# Patient Record
Sex: Female | Born: 1935 | Race: White | Hispanic: No | Smoking: Never smoker
Health system: Southern US, Community
[De-identification: ages and names within clinical notes are randomized; demographics above are authoritative.]

## PROBLEM LIST (undated history)

## (undated) DIAGNOSIS — D649 Anemia, unspecified: Secondary | ICD-10-CM

## (undated) DIAGNOSIS — R32 Unspecified urinary incontinence: Secondary | ICD-10-CM

## (undated) DIAGNOSIS — G2581 Restless legs syndrome: Secondary | ICD-10-CM

## (undated) DIAGNOSIS — E7800 Pure hypercholesterolemia, unspecified: Secondary | ICD-10-CM

## (undated) DIAGNOSIS — E079 Disorder of thyroid, unspecified: Secondary | ICD-10-CM

## (undated) DIAGNOSIS — G629 Polyneuropathy, unspecified: Secondary | ICD-10-CM

## (undated) DIAGNOSIS — K219 Gastro-esophageal reflux disease without esophagitis: Secondary | ICD-10-CM

## (undated) DIAGNOSIS — K5790 Diverticulosis of intestine, part unspecified, without perforation or abscess without bleeding: Secondary | ICD-10-CM

## (undated) DIAGNOSIS — F0390 Unspecified dementia without behavioral disturbance: Secondary | ICD-10-CM

---

## 2010-10-11 ENCOUNTER — Emergency Department (HOSPITAL_COMMUNITY): Admission: EM | Admit: 2010-10-11 | Discharge: 2010-10-11 | Payer: Self-pay | Admitting: Emergency Medicine

## 2011-04-16 ENCOUNTER — Inpatient Hospital Stay (HOSPITAL_COMMUNITY)
Admission: AD | Admit: 2011-04-16 | Discharge: 2011-04-19 | DRG: 812 | Disposition: A | Discharge status: Hospice | Payer: Medicare Other | Source: Ambulatory Visit | Attending: Internal Medicine | Admitting: Internal Medicine

## 2011-04-16 DIAGNOSIS — E785 Hyperlipidemia, unspecified: Secondary | ICD-10-CM | POA: Diagnosis present

## 2011-04-16 DIAGNOSIS — F0390 Unspecified dementia without behavioral disturbance: Secondary | ICD-10-CM | POA: Diagnosis present

## 2011-04-16 DIAGNOSIS — K219 Gastro-esophageal reflux disease without esophagitis: Secondary | ICD-10-CM | POA: Diagnosis present

## 2011-04-16 DIAGNOSIS — H409 Unspecified glaucoma: Secondary | ICD-10-CM | POA: Diagnosis present

## 2011-04-16 DIAGNOSIS — J449 Chronic obstructive pulmonary disease, unspecified: Secondary | ICD-10-CM | POA: Diagnosis present

## 2011-04-16 DIAGNOSIS — I4891 Unspecified atrial fibrillation: Secondary | ICD-10-CM | POA: Diagnosis present

## 2011-04-16 DIAGNOSIS — Z515 Encounter for palliative care: Secondary | ICD-10-CM

## 2011-04-16 DIAGNOSIS — D500 Iron deficiency anemia secondary to blood loss (chronic): Principal | ICD-10-CM | POA: Diagnosis present

## 2011-04-16 DIAGNOSIS — K5730 Diverticulosis of large intestine without perforation or abscess without bleeding: Secondary | ICD-10-CM | POA: Diagnosis present

## 2011-04-16 DIAGNOSIS — E039 Hypothyroidism, unspecified: Secondary | ICD-10-CM | POA: Diagnosis present

## 2011-04-16 DIAGNOSIS — E46 Unspecified protein-calorie malnutrition: Secondary | ICD-10-CM | POA: Diagnosis present

## 2011-04-16 DIAGNOSIS — E538 Deficiency of other specified B group vitamins: Secondary | ICD-10-CM | POA: Diagnosis present

## 2011-04-16 DIAGNOSIS — Z9181 History of falling: Secondary | ICD-10-CM

## 2011-04-16 DIAGNOSIS — E871 Hypo-osmolality and hyponatremia: Secondary | ICD-10-CM | POA: Diagnosis present

## 2011-04-16 DIAGNOSIS — J4489 Other specified chronic obstructive pulmonary disease: Secondary | ICD-10-CM | POA: Diagnosis present

## 2011-04-16 DIAGNOSIS — G609 Hereditary and idiopathic neuropathy, unspecified: Secondary | ICD-10-CM | POA: Diagnosis present

## 2011-04-16 DIAGNOSIS — Z7982 Long term (current) use of aspirin: Secondary | ICD-10-CM

## 2011-04-16 LAB — CBC
Hemoglobin: 5.7 g/dL — CL (ref 12.0–15.0)
MCV: 70.1 fL — ABNORMAL LOW (ref 78.0–100.0)
Platelets: 281 10*3/uL (ref 150–400)
RBC: 2.68 MIL/uL — ABNORMAL LOW (ref 3.87–5.11)
WBC: 6.3 10*3/uL (ref 4.0–10.5)

## 2011-04-16 LAB — ABO/RH: ABO/RH(D): A POS

## 2011-04-16 LAB — TSH: TSH: 3.319 u[IU]/mL (ref 0.350–4.500)

## 2011-04-16 LAB — URINALYSIS, ROUTINE W REFLEX MICROSCOPIC
Nitrite: NEGATIVE
Specific Gravity, Urine: 1.012 (ref 1.005–1.030)
Urobilinogen, UA: 1 mg/dL (ref 0.0–1.0)

## 2011-04-16 LAB — VITAMIN B12: Vitamin B-12: 1252 pg/mL — ABNORMAL HIGH (ref 211–911)

## 2011-04-17 LAB — COMPREHENSIVE METABOLIC PANEL
Albumin: 3.5 g/dL (ref 3.5–5.2)
Alkaline Phosphatase: 69 U/L (ref 39–117)
BUN: 11 mg/dL (ref 6–23)
Chloride: 100 mEq/L (ref 96–112)
Glucose, Bld: 91 mg/dL (ref 70–99)
Potassium: 3.5 mEq/L (ref 3.5–5.1)
Total Bilirubin: 0.7 mg/dL (ref 0.3–1.2)

## 2011-04-17 LAB — CBC
HCT: 28.5 % — ABNORMAL LOW (ref 36.0–46.0)
Hemoglobin: 9.1 g/dL — ABNORMAL LOW (ref 12.0–15.0)
MCH: 23.3 pg — ABNORMAL LOW (ref 26.0–34.0)
MCV: 72.3 fL — ABNORMAL LOW (ref 78.0–100.0)
Platelets: 245 10*3/uL (ref 150–400)
RBC: 3.94 MIL/uL (ref 3.87–5.11)
RBC: 3.52 MIL/uL — ABNORMAL LOW (ref 3.87–5.11)
WBC: 9.1 10*3/uL (ref 4.0–10.5)
WBC: 7.3 10*3/uL (ref 4.0–10.5)

## 2011-04-17 NOTE — H&P (Signed)
NAMEJULANN, Sharon Fletcher           ACCOUNT NO.:  [id number]  MEDICAL RECORD NO.:  [id number]           PATIENT TYPE:  I  LOCATION:  4503                         FACILITY:  [facility]  PHYSICIAN:  Clydia Llano, MD       DATE OF BIRTH:  12-08-1929  DATE OF ADMISSION:  04/16/2011 DATE OF DISCHARGE:                             HISTORY & PHYSICAL   PRIMARY CARE PHYSICIAN:  L. Lupe Carney, MD  GASTROENTEROLOGIST:  Llana Aliment. Randa Evens, MD  REASON FOR ADMISSION:  Anemia.  BRIEF HISTORY AND EXAMINATION:  Ms. Azad is an 77 year old pleasantly demented Caucasian female.  Came into the hospital from Dr. Randa Evens' office.  The patient lives in assisted living facility and it has been noticed that her hemoglobin is trending down.  I have sent the patient today to Dr. Randa Evens for evaluation.  He noticed steady decrease in her hemoglobin, before last November 2010 she had hemoglobin of around 12.  April 08, 2011, her hemoglobin was 7.2, and today herhemoglobin is 6.4.  The patient is demented, it is hard to get history from, but denies any hematemesis or melena.  I spoke with Dr. Randa Evens, her fecal occult blood testing was negative at the clinic and he wants to admit her for further evaluation.  PAST MEDICAL HISTORY: 1. Dementia. 2. Peripheral neuropathy. 3. Cervical stenosis. 4. Lumbar stenosis. 5. B12 deficiency. 6. Sleep apnea. 7. Atrial fibrillation. 8. Glaucoma. 9. Gastroesophageal reflux disease. 10.Diverticula. 11.Hyperlipidemia. 12.Hypothyroidism.  MEDICATIONS: 1. Amiodarone 200 mg p.o. daily. 2. Atorvastatin 20 mg p.o. daily. 3. Levothyroxine 50 mcg p.o. daily. 4. Omeprazole 20 mg p.o. daily. 5. Gabapentin 100 mg p.o. daily. 6. Aspirin 325 mg p.o. daily. 7. Lotrisone 0.5% lotion apply to affected area twice daily. 8. Cyanocobalamin 1000 mcg p.o. daily. 9. Combigan 0.2 to 0.5% solution 1 drop in effect twice a day. 10.Lumigan 0.03% solution 1 drop in to affected eye  every evening once     a day. 11.Patanol 0.1% solution 1 drop in affected right twice a day. 12.Artificial tears solution 1 drop both eyes 4 times a day. 13.Polyethylene glycol powder 1 capful and liquid once a day. 14.Fish oil 1000 mg p.o. daily. 15.Ear wax removal daily. 16.Cipro 250 mg p.o. b.i.d.  ALLERGIES:  NKDA.  FAMILY HISTORY:  Unable to provide, the patient is demented.  SOCIAL HISTORY:  The patient lives in assisted living facility in [place] because of dementia.  REVIEW OF SYSTEMS:  GENERAL:  Denies fevers, chills, or sweats. HEENT:  Denies headache, denies nasal discharge. SKIN:  Denies rash or lesions. CARDIAC:  Denies chest pain, palpitations, or orthopnea. PULMONARY:  Denies shortness of breath.  Denies wheezing or cough. GENITOURINARY:  Denies frequency, urgency, or dysuria. NEUROLOGY:  Denies weakness, numbness, or disturbance. MUSCULOSKELETAL:  Denies arthralgia, joint swelling, deformity, or pain. GASTROINTESTINAL:  Denies nausea, vomiting, or diarrhea. ENDOCRINE:  Denies polyuria or dysuria. PSYCHIATRIC:  Denies mood disturbances or depression.  PHYSICAL EXAMINATION:  VITAL SIGNS:  Temperature is 97.4, pulse is 67, respirations 18, blood pressure is 111/64, and O2 sats is 99% on room air. GENERAL:  The patient is well-developed Caucasian female  sitting on a chair wearing hospital gown in no acute distress.  The patient is alert and awake, disoriented to place, time, and person. HEENT:  Head is normocephalic, atraumatic.  Normal eyes.  Pupils are equal and reactive to light and accommodation.  Mouth without oral thrush or lesions. NECK:  Supple.  No masses, no meningeal signs, no lymphadenopathy. CHEST:  Moves to breathing bilaterally with symmetry. LUNGS:  Clear to auscultation bilaterally. CARDIOVASCULAR:  Regular rate and rhythm.  No murmurs, rubs, or gallops. ABDOMEN:  Bowel sounds heard.  Soft, nontender or distended.   No hepatosplenomegaly. GENITOURINARY:  No CVA tenderness appreciated. LOWER EXTREMITIES:  No pedal edema. NEUROLOGIC:  Alert and awake, disoriented.  No focal motor or sensory deficit.  Cranial nerves II through XII grossly intact.  LABORATORY DATA:  Labs per Dr. Diamantina Providence office hemoglobin 6.4 yesterday at 3 p.m.  ASSESSMENT/PLAN: 1. Anemia.  The patient will be admitted to the hospital for further     evaluation.  Anemia panel will be obtained.  The patient has     microcytic with MCV of 69, probably it is iron-deficiency anemia.     Dr. Randa Evens to evaluate the patient.  Because of anemia of 6.4, the     patient will have 2 units of blood transfused after the anemia     panel obtained. 2. Atrial fibrillation.  From the records, the patient on amiodarone     and full dose aspirin, not on Coumadin.  I do not have the exact     reason maybe because of her age and fall risk.  According to the     records here, she was evaluated in October 2011, with CT scan. 3. Hypothyroidism.  We will restart the levothyroxine. 4. Gastroesophageal reflux disease on omeprazole 20 mg that will be     continued.     Clydia Llano, MD     ME/MEDQ  D:  04/16/2011  T:  04/16/2011  Job:  [id number]  cc:   Fayrene Fearing L. Malon Kindle., M.D. Elsworth Soho, M.D.  Electronically Signed by Clydia Llano  on 04/17/2011 12:45:16 PM

## 2011-04-18 ENCOUNTER — Inpatient Hospital Stay (HOSPITAL_COMMUNITY): Payer: Medicare Other

## 2011-04-18 LAB — CBC
HCT: 24.7 % — ABNORMAL LOW (ref 36.0–46.0)
HCT: 26.1 % — ABNORMAL LOW (ref 36.0–46.0)
Hemoglobin: 8.3 g/dL — ABNORMAL LOW (ref 12.0–15.0)
RBC: 3.55 MIL/uL — ABNORMAL LOW (ref 3.87–5.11)
RDW: 18.4 % — ABNORMAL HIGH (ref 11.5–15.5)
WBC: 6.4 10*3/uL (ref 4.0–10.5)
WBC: 7.1 10*3/uL (ref 4.0–10.5)

## 2011-04-18 LAB — CROSSMATCH
Antibody Screen: NEGATIVE
Unit division: 0

## 2011-04-18 LAB — BASIC METABOLIC PANEL
Glucose, Bld: 99 mg/dL (ref 70–99)
Potassium: 3.3 mEq/L — ABNORMAL LOW (ref 3.5–5.1)
Sodium: 135 mEq/L (ref 135–145)

## 2011-04-19 LAB — CBC
HCT: 27.4 % — ABNORMAL LOW (ref 36.0–46.0)
Hemoglobin: 8.7 g/dL — ABNORMAL LOW (ref 12.0–15.0)
MCH: 23.3 pg — ABNORMAL LOW (ref 26.0–34.0)
MCV: 73.3 fL — ABNORMAL LOW (ref 78.0–100.0)
RBC: 3.74 MIL/uL — ABNORMAL LOW (ref 3.87–5.11)

## 2011-04-20 NOTE — Discharge Summary (Signed)
Sharon Fletcher, LEMBERGER           ACCOUNT NO.:  [id number]  MEDICAL RECORD NO.:  [id number]           PATIENT TYPE:  I  LOCATION:  4715                         FACILITY:  [facility]  PHYSICIAN:  Pleas Koch, MD        DATE OF BIRTH:  1929-09-11  DATE OF ADMISSION:  04/16/2011 DATE OF DISCHARGE:  04/19/2011                              DISCHARGE SUMMARY   DISCHARGE DIAGNOSES: 1. Anemia. 2. Atrial fibrillation. 3. Hypothyroidism. 4. Gastroesophageal reflux disease. 5. Glaucoma. 6. Colonic diverticula. 7. Dementia.  PERTINENT CONSULTS:  Gastroenterology, Dr. Willis Modena and Palliative Care Medicine, Dorian Pod, ACNP.  DISCHARGE MEDICATIONS: 1. Hydrocortisone 1% topically one application b.i.d. 2. QVAR 8.7/120 two puffs b.i.d. 3. Combigan to both eyes 1 drop q.12. 4. Please note amiodarone has been discontinued. 5. Gabapentin 100 mg b.i.d. 6. Clotrimazole 1-0.05% topically one application b.i.d. 7. Fish oil 1 cap b.i.d.8. polyethylene glycol 17 grams daily. 9. Please note, the atorvastatin has been discontinued as is aspirin     325 mg. 10.Lumigan 0.03% to both eyes one drop at bedtime. 11.Ciprofloxacin was discontinued. 12.Levothyroxine 50 mcg p.o. daily. 13.Debrox ear drops to each ear 3 drops daily. 14.Artificial tears both eyes q.i.d. 15.PreviDent Gel to brush teeth one application b.i.d. 16.Vitamin B12 one tablet q.a.m.  HISTORY:  Briefly, this is an 77 year old demented Caucasian female, admitted from Dr. Lily Lovings office.  She lives in an assisted living facility as noted that her hemoglobin is trending down.  Dr. Randa Evens sent into hospital for evaluation as he noted slightly decreased hemoglobin, in November 2010 it was 12; April 08, 2011, it was 7.2; and today is 6.4.  The patient is dementia, hard to get a history, but denies any hematemesis or melena.  Dr. Randa Evens noted negative fecal occult blood testing per admit MD's note.  HOSPITAL COURSE: 1.  GI  Bleed:  The patient was transfused 2 units of packed red blood     cells and maintained a hemoglobin of about 8 to 9.  She underwent a     barium enema on Apr 18, 2011, which showed mild-to-moderate degree     of single contrast exam with no circumferential abnormalities and     extensive colonic diverticulosis.  It was discussed with the     patient's relative, son Sharon Fletcher, and palliative care team goals of     care and it was noted that the patient may benefit from another     transfusion at some point in time, however, at this point, there     will be no further aggressive measures.  The patient was     discontinued off her aspirin.  The patient also discontinued off     her amiodarone as she is out of AFib.  I will call her son, Sharon Fletcher,     at [phone] to explain our concerns. 2. AFib.  The patient does have an elevated [place] score; however, this     will be precluded with her recent anemia. 3  Anemia.  This is likely anemia of iron deficiency with some amount of blood loss.  Her iron level was less 10 and  vitamin B12 is 1252.  It does seem to be a microcytic anemia. 4. Hypothyroidism.  She will continue on her thyroxine as previously     on. 5. Hyponatremia.  The patient's sodium was repleted with IV fluids and     this resolved. 6. COPD.  The patient can continue her QVAR. 7. Hyperlipidemia.  I have discontinued her statin as I do not believe     that she would benefit from this long-term as the prognosis for the     patient has been discharged as discussion per palliative care team     and Dr. Dulce Sellar and myself.  The patient will likely benefit from     being on hospice and have palliative care follow as an outpatient.  The patient was seen on day of discharge and noted to be doing well.  PHYSICAL EXAMINATION:  Her vitals were as follows. VITALS:  Temperature is 97, blood pressure is 131-150/74-84, pulse was 72, respirations are 20, the patient is satting 95% on room  air. GENERAL:  She is pleasantly demented.  She had some saccades of her eyes. CHEST:  Clinically clear. ABDOMEN:  Soft, nontender, and nondistended.  She was pleasantly demented, but was in no apparent distress.          ______________________________ Pleas Koch, MD     JS/MEDQ  D:  04/19/2011  T:  04/19/2011  Job:  [id number]  cc:   Dorian Pod, ACNP Willis Modena, MD  Electronically Signed by Pleas Koch MD on 04/20/2011 03:53:22 PM

## 2011-05-22 NOTE — Consult Note (Signed)
NAME:  Sharon Fletcher, Sharon Fletcher           ACCOUNT NO.:  [id number]  MEDICAL RECORD NO.:  [id number]           PATIENT TYPE:  LOCATION:                                 FACILITY:  PHYSICIAN:  Meria Crilly L. Ladona Ridgel, MD  DATE OF BIRTH:  1929-05-27  DATE OF CONSULTATION:  04/18/2011 DATE OF DISCHARGE:                                CONSULTATION   REQUESTING PHYSICIAN:  Pleas Koch, MD  COLLABORATING PHYSICIAN:  Trinitie Mcgirr L. Ladona Ridgel, MD  REASON FOR CONSULTATION:  Medical treatment options.  Dorian Pod reviewed medical records, received report from the team, examined the patient, then met with the patient's son Sharon Fletcher to discuss goals, options, and end-of-life issues.  A detailed discussion was had regarding advanced directives with concept specific to the difference between an aggressive medical interventions path versus a palliative comfort care path for this patient at this time in her current situation.  Time in 13:30, time out 3:00 p.m. with a 90-minute consultation.  Greater than 50% of this time spent in medical counseling and coordination of care and discussion of the pathophysiology of the patient's disease process.  Also discussed the patient's case with Dr. Willis Modena who is following along with the patient.  PROBLEM LIST: 1. Generalized weakness with anemia, hemoglobin 5.7 in the setting of     iron deficiency. 2. Altered mental status. 3. Protein malnutrition. 4. Agitation. 5. Anorexia. 6. Palliative performance score of 30%.  Goals of care established by the patient's son at this time and confirmed with the patient. 1. DNR/DNI status. 2. MOST letters form completed which states DNR/DNI status, limited     additional interventions.  The patient's son is receptive to using     medical treatments including blood, blood transfusions, short-term     IV fluids, has indicated labs but no invasive testing.  He is     receptive to his mom, return to the hospital as  indicated but avoid     ICU stay.  Antibiotics:  Complete current round of antibiotics.  No     further antibiotic treatments.  IV fluids:  Short-term treatment     only.  Nutrition:  Comfort feeds only.  No PEG or panda placement     or TPN therapy.  Son is very realistic.  We discussed the     physiology of his mother's dementia which is the underlying     etiology for her failure to thrive.  We discussed recurrent     infections, her high risk for falls and her anemia.  Note Dr.     Dulce Sellar does not feel the patient's blood loss is related to GI     issues, most likely chronic iron deficiency.  [name], the patient's     son wants to follow labs now and transfuse if indicated for     comfort, but eventually stop lab work and blood products.  DISPOSITION:  The plan is to return to [facility] with hospice. Son has MOST form already and I have signed it to adjust medications.  I have done and discussed it with Dr. Mahala Menghini and Dr. Dulce Sellar with the changes the  patient's son wish to make with medications.  Thank you for the opportunity to assist with this patient's care. Dorian Pod could be reached at [phone].     Dorian Pod, ACNP   ______________________________ Katharina Caper. Ladona Ridgel, MD    MB/MEDQ  D:  05/05/2011  T:  05/05/2011  Job:  [id number]  cc:   Pleas Koch, MD Willis Modena, MD  Electronically Signed by Dorian Pod ACNP on 05/13/2011 10:43:13 AM Electronically Signed by Derenda Mis MD on 05/22/2011 11:55:51 AM

## 2011-05-26 ENCOUNTER — Emergency Department (HOSPITAL_COMMUNITY)
Admission: EM | Admit: 2011-05-26 | Discharge: 2011-05-26 | Disposition: A | Discharge status: Home / Self Care | Payer: Medicare Other | Attending: Emergency Medicine | Admitting: Emergency Medicine

## 2011-05-26 DIAGNOSIS — F0390 Unspecified dementia without behavioral disturbance: Secondary | ICD-10-CM | POA: Insufficient documentation

## 2011-05-26 DIAGNOSIS — S0990XA Unspecified injury of head, initial encounter: Secondary | ICD-10-CM | POA: Insufficient documentation

## 2011-05-26 DIAGNOSIS — Y92009 Unspecified place in unspecified non-institutional (private) residence as the place of occurrence of the external cause: Secondary | ICD-10-CM | POA: Insufficient documentation

## 2011-05-26 DIAGNOSIS — W19XXXA Unspecified fall, initial encounter: Secondary | ICD-10-CM | POA: Insufficient documentation

## 2011-06-30 NOTE — Consult Note (Signed)
NAMELYNDAL, ALAMILLO           ACCOUNT NO.:  [id number]  MEDICAL RECORD NO.:  [id number]           PATIENT TYPE:  I  LOCATION:  4715                         FACILITY:  [facility]  PHYSICIAN:  Willis Modena, MD     DATE OF BIRTH:  [birth year]  DATE OF CONSULTATION:  04/17/2011 DATE OF DISCHARGE:                                CONSULTATION   REASON FOR CONSULTATION:  Anemia.  CHIEF COMPLAINT:  Anemia.  HISTORY OF PRESENT ILLNESS:  Ms. Fialkowski is an 77 year old female with profound dementia.  She lives in an assisted living facility and currently has a Comptroller in the room with her.  She is unable to provide any meaningful history.  She was apparently seen by Dr. Randa Evens in our office and admitted here for evaluation of anemia with microcytosis. There is no obvious report of GI bleeding.  No other history is available.  It is uncertain whether she has had endoscopies or colonoscopies in the past.  PAST MEDICAL/PAST SURGICAL HISTORY/HOME MEDICATIONS/ALLERGIES/FAMILY HISTORY/SOCIAL HISTORY/REVIEW OF SYSTEMS:  All from dictated note from Dr. Arthor Captain dated Apr 16, 2011.  I have reviewed and I agree.  PHYSICAL EXAMINATION:  VITAL SIGNS:  T-max 99.4, oxygen saturation 90% on room air, pulse 68, respiratory rate 20, blood pressure 124/69. GENERAL:  Ms. Salamon is pleasantly demented, unable to answer any questions appropriately. HEENT:  No oropharyngeal lesions.  Eyes:  Sclerae anicteric. Conjunctivae are pale. NECK:  Supple. LUNGS:  Clear. HEART:  Regular.  No murmur, rub, or gallop. ABDOMEN:  Soft, nontender, nondistended.  Normoactive bowel sounds. EXTREMITIES:  No peripheral cyanosis, clubbing, or edema. NEUROLOGIC:  Demented, unable to answer any questions meaningfully, otherwise seemingly nonfocal.  LABORATORY DATA:  Hemoglobin admission 5.7, currently 9.1 after transfusion.  White count 9.1, platelet count 291,000.  Sodium 132, potassium 3.5, chloride 100, bicarb 21, BUN  11, creatinine 0.5.  Liver tests are normal.  Iron indices show a low iron and a borderline low ferritin, MCV 72.  B12, folate levels are not low.  IMPRESSION:  Ms. Frier is a 77 year old female with profound dementia presenting with anemia.  There is no overt bleeding.  I am unable to provide any other history from her.  It is unclear whether she has had endoscopy or colonoscopy in the past.  PLAN: 1. At this point I am uncertain whether there is any utility in     pursuing diagnostic evaluation for her anemia. 2. I have made attempts to reach the patient's son, [name],     but have been unable to do so.  I have left a message asking him to     call us back. 3. I would like to have an in depth conversation with him about the     relative risks and benefits for endoscopy and colonoscopy, which,     outside of her profound dementia, would likely be the best     diagnostic test.  However, in this situation, it is unclear to me     whether she would be a candidate for chemotherapy, surgery, or any     kind of therapy even should she have  something severe such as a     malignancy identified.  Thanks again for allowing [place] to participate in the care of Ms. Wyke's care.     Willis Modena, MD     WO/MEDQ  D:  04/17/2011  T:  04/17/2011  Job:  [id number]  Electronically Signed by Willis Modena  on 06/30/2011 01:14:03 PM

## 2012-04-19 ENCOUNTER — Other Ambulatory Visit (HOSPITAL_COMMUNITY): Payer: Self-pay | Admitting: *Deleted

## 2012-04-21 ENCOUNTER — Ambulatory Visit (HOSPITAL_COMMUNITY)
Admission: RE | Admit: 2012-04-21 | Discharge: 2012-04-21 | Disposition: A | Discharge status: Home / Self Care | Payer: Medicare Other | Source: Ambulatory Visit | Attending: Family Medicine | Admitting: Family Medicine

## 2012-04-21 DIAGNOSIS — Z5189 Encounter for other specified aftercare: Secondary | ICD-10-CM | POA: Insufficient documentation

## 2012-04-21 MED ORDER — FUROSEMIDE 10 MG/ML IJ SOLN
20.0000 mg | Freq: Once | INTRAMUSCULAR | Status: AC
  Administered 2012-04-21: 20 mg via INTRAVENOUS
  Filled 2012-04-21 (×2): qty 2

## 2012-04-22 LAB — TYPE AND SCREEN
ABO/RH(D): A POS
Antibody Screen: NEGATIVE
Unit division: 0

## 2012-05-05 ENCOUNTER — Observation Stay (HOSPITAL_COMMUNITY): Payer: Medicare Other

## 2012-05-05 ENCOUNTER — Inpatient Hospital Stay (HOSPITAL_COMMUNITY)
Admission: EM | Admit: 2012-05-05 | Discharge: 2012-05-08 | DRG: 193 | Disposition: A | Discharge status: Home Health | Payer: Medicare Other | Attending: Internal Medicine | Admitting: Internal Medicine

## 2012-05-05 ENCOUNTER — Encounter (HOSPITAL_COMMUNITY): Payer: Self-pay | Admitting: Neurology

## 2012-05-05 DIAGNOSIS — Z79899 Other long term (current) drug therapy: Secondary | ICD-10-CM

## 2012-05-05 DIAGNOSIS — Z66 Do not resuscitate: Secondary | ICD-10-CM | POA: Diagnosis present

## 2012-05-05 DIAGNOSIS — M48061 Spinal stenosis, lumbar region without neurogenic claudication: Secondary | ICD-10-CM | POA: Diagnosis present

## 2012-05-05 DIAGNOSIS — R404 Transient alteration of awareness: Secondary | ICD-10-CM

## 2012-05-05 DIAGNOSIS — K219 Gastro-esophageal reflux disease without esophagitis: Secondary | ICD-10-CM | POA: Diagnosis present

## 2012-05-05 DIAGNOSIS — R112 Nausea with vomiting, unspecified: Secondary | ICD-10-CM

## 2012-05-05 DIAGNOSIS — H409 Unspecified glaucoma: Secondary | ICD-10-CM | POA: Diagnosis present

## 2012-05-05 DIAGNOSIS — G609 Hereditary and idiopathic neuropathy, unspecified: Secondary | ICD-10-CM | POA: Diagnosis present

## 2012-05-05 DIAGNOSIS — J449 Chronic obstructive pulmonary disease, unspecified: Secondary | ICD-10-CM | POA: Diagnosis present

## 2012-05-05 DIAGNOSIS — F0390 Unspecified dementia without behavioral disturbance: Secondary | ICD-10-CM | POA: Diagnosis present

## 2012-05-05 DIAGNOSIS — G9340 Encephalopathy, unspecified: Secondary | ICD-10-CM

## 2012-05-05 DIAGNOSIS — Z515 Encounter for palliative care: Secondary | ICD-10-CM

## 2012-05-05 DIAGNOSIS — G2581 Restless legs syndrome: Secondary | ICD-10-CM | POA: Diagnosis present

## 2012-05-05 DIAGNOSIS — M4802 Spinal stenosis, cervical region: Secondary | ICD-10-CM | POA: Diagnosis present

## 2012-05-05 DIAGNOSIS — I959 Hypotension, unspecified: Secondary | ICD-10-CM

## 2012-05-05 DIAGNOSIS — E538 Deficiency of other specified B group vitamins: Secondary | ICD-10-CM | POA: Diagnosis present

## 2012-05-05 DIAGNOSIS — J4489 Other specified chronic obstructive pulmonary disease: Secondary | ICD-10-CM | POA: Diagnosis present

## 2012-05-05 DIAGNOSIS — J189 Pneumonia, unspecified organism: Principal | ICD-10-CM | POA: Diagnosis present

## 2012-05-05 DIAGNOSIS — M159 Polyosteoarthritis, unspecified: Secondary | ICD-10-CM | POA: Diagnosis present

## 2012-05-05 DIAGNOSIS — R4182 Altered mental status, unspecified: Secondary | ICD-10-CM | POA: Diagnosis present

## 2012-05-05 DIAGNOSIS — I4891 Unspecified atrial fibrillation: Secondary | ICD-10-CM | POA: Diagnosis present

## 2012-05-05 DIAGNOSIS — E032 Hypothyroidism due to medicaments and other exogenous substances: Secondary | ICD-10-CM

## 2012-05-05 DIAGNOSIS — G929 Unspecified toxic encephalopathy: Secondary | ICD-10-CM | POA: Diagnosis present

## 2012-05-05 DIAGNOSIS — G92 Toxic encephalopathy: Secondary | ICD-10-CM | POA: Diagnosis present

## 2012-05-05 DIAGNOSIS — D649 Anemia, unspecified: Secondary | ICD-10-CM | POA: Diagnosis present

## 2012-05-05 DIAGNOSIS — E039 Hypothyroidism, unspecified: Secondary | ICD-10-CM | POA: Diagnosis present

## 2012-05-05 DIAGNOSIS — E785 Hyperlipidemia, unspecified: Secondary | ICD-10-CM | POA: Diagnosis present

## 2012-05-05 DIAGNOSIS — E860 Dehydration: Secondary | ICD-10-CM | POA: Diagnosis present

## 2012-05-05 DIAGNOSIS — D509 Iron deficiency anemia, unspecified: Secondary | ICD-10-CM | POA: Diagnosis present

## 2012-05-05 DIAGNOSIS — G4733 Obstructive sleep apnea (adult) (pediatric): Secondary | ICD-10-CM | POA: Diagnosis present

## 2012-05-05 HISTORY — DX: Unspecified dementia, unspecified severity, without behavioral disturbance, psychotic disturbance, mood disturbance, and anxiety: F03.90

## 2012-05-05 HISTORY — DX: Diverticulosis of intestine, part unspecified, without perforation or abscess without bleeding: K57.90

## 2012-05-05 HISTORY — DX: Gastro-esophageal reflux disease without esophagitis: K21.9

## 2012-05-05 HISTORY — DX: Unspecified urinary incontinence: R32

## 2012-05-05 HISTORY — DX: Anemia, unspecified: D64.9

## 2012-05-05 HISTORY — DX: Restless legs syndrome: G25.81

## 2012-05-05 HISTORY — DX: Polyneuropathy, unspecified: G62.9

## 2012-05-05 HISTORY — DX: Pure hypercholesterolemia, unspecified: E78.00

## 2012-05-05 LAB — CBC
MCH: 25.1 pg — ABNORMAL LOW (ref 26.0–34.0)
MCHC: 31.1 g/dL (ref 30.0–36.0)
MCV: 80.7 fL (ref 78.0–100.0)
Platelets: ADEQUATE 10*3/uL (ref 150–400)

## 2012-05-05 LAB — BASIC METABOLIC PANEL
Calcium: 8.9 mg/dL (ref 8.4–10.5)
Creatinine, Ser: 0.38 mg/dL — ABNORMAL LOW (ref 0.50–1.10)
GFR calc non Af Amer: >90 mL/min (ref >90)
Glucose, Bld: 81 mg/dL (ref 70–99)
Sodium: 136 mEq/L (ref 135–145)

## 2012-05-05 LAB — PROCALCITONIN: Procalcitonin: <0.1 ng/mL

## 2012-05-05 LAB — CARDIAC PANEL(CRET KIN+CKTOT+MB+TROPI)
CK, MB: 2.9 ng/mL (ref 0.3–4.0)
Relative Index: INVALID (ref 0.0–2.5)
Total CK: 58 U/L (ref 7–177)
Troponin I: <0.3 ng/mL (ref <0.30)

## 2012-05-05 LAB — DIFFERENTIAL
Eosinophils Absolute: 0.4 10*3/uL (ref 0.0–0.7)
Lymphs Abs: 2.6 10*3/uL (ref 0.7–4.0)
Monocytes Absolute: 0.7 10*3/uL (ref 0.1–1.0)
Neutrophils Relative %: 49 % (ref 43–77)

## 2012-05-05 LAB — URINALYSIS, ROUTINE W REFLEX MICROSCOPIC
Glucose, UA: NEGATIVE mg/dL
Hgb urine dipstick: NEGATIVE
Ketones, ur: NEGATIVE mg/dL
Protein, ur: NEGATIVE mg/dL
Urobilinogen, UA: 2 mg/dL — ABNORMAL HIGH (ref 0.0–1.0)

## 2012-05-05 LAB — LACTIC ACID, PLASMA: Lactic Acid, Venous: 1.5 mmol/L (ref 0.5–2.2)

## 2012-05-05 MED ORDER — RISPERIDONE 1 MG PO TABS
1.0000 mg | ORAL_TABLET | Freq: Every day | ORAL | Status: DC
Start: 2012-05-05 — End: 2012-05-08
  Administered 2012-05-05 – 2012-05-08 (×4): 1 mg via ORAL
  Filled 2012-05-05 (×4): qty 1

## 2012-05-05 MED ORDER — OLOPATADINE HCL 0.1 % OP SOLN
1.0000 [drp] | Freq: Two times a day (BID) | OPHTHALMIC | Status: DC
  Administered 2012-05-05 – 2012-05-08 (×6): 1 [drp] via OPHTHALMIC
  Filled 2012-05-05: qty 5

## 2012-05-05 MED ORDER — TIMOLOL MALEATE 0.5 % OP SOLN
1.0000 [drp] | Freq: Two times a day (BID) | OPHTHALMIC | Status: DC
  Administered 2012-05-05 – 2012-05-08 (×6): 1 [drp] via OPHTHALMIC
  Filled 2012-05-05: qty 5

## 2012-05-05 MED ORDER — OMEGA-3-ACID ETHYL ESTERS 1 G PO CAPS
1.0000 g | ORAL_CAPSULE | Freq: Two times a day (BID) | ORAL | Status: DC
  Administered 2012-05-05 – 2012-05-08 (×5): 1 g via ORAL
  Filled 2012-05-05 (×7): qty 1

## 2012-05-05 MED ORDER — HYPROMELLOSE 0.4 % OP SOLN: 1.0000 [drp] | Freq: Four times a day (QID) | OPHTHALMIC | Status: DC

## 2012-05-05 MED ORDER — SODIUM CHLORIDE 0.9 % IJ SOLN
3.0000 mL | Freq: Two times a day (BID) | INTRAMUSCULAR | Status: DC
  Administered 2012-05-05 – 2012-05-07 (×2): 3 mL via INTRAVENOUS

## 2012-05-05 MED ORDER — IOHEXOL 350 MG/ML SOLN
100.0000 mL | Freq: Once | INTRAVENOUS | Status: AC | PRN
  Administered 2012-05-05: 100 mL via INTRAVENOUS

## 2012-05-05 MED ORDER — ACETAMINOPHEN 650 MG RE SUPP: 650.0000 mg | Freq: Four times a day (QID) | RECTAL | Status: DC | PRN

## 2012-05-05 MED ORDER — ONDANSETRON HCL 4 MG/2ML IJ SOLN: 4.0000 mg | Freq: Four times a day (QID) | INTRAMUSCULAR | Status: DC | PRN

## 2012-05-05 MED ORDER — SODIUM CHLORIDE 0.9 % IJ SOLN: 3.0000 mL | INTRAMUSCULAR | Status: DC | PRN

## 2012-05-05 MED ORDER — ACETAMINOPHEN 325 MG PO TABS: 650.0000 mg | ORAL_TABLET | Freq: Four times a day (QID) | ORAL | Status: DC | PRN

## 2012-05-05 MED ORDER — OMEGA-3 FATTY ACIDS 1000 MG PO CAPS
1.0000 g | ORAL_CAPSULE | Freq: Two times a day (BID) | ORAL | Status: DC
Start: 2012-05-05 — End: 2012-05-05

## 2012-05-05 MED ORDER — POLYETHYLENE GLYCOL 3350 17 G PO PACK
17.0000 g | PACK | Freq: Every day | ORAL | Status: DC
  Administered 2012-05-05 – 2012-05-08 (×4): 17 g via ORAL
  Filled 2012-05-05 (×4): qty 1

## 2012-05-05 MED ORDER — ONDANSETRON HCL 4 MG PO TABS: 4.0000 mg | ORAL_TABLET | Freq: Four times a day (QID) | ORAL | Status: DC | PRN

## 2012-05-05 MED ORDER — SODIUM CHLORIDE 0.9 % IV BOLUS (SEPSIS)
500.0000 mL | Freq: Once | INTRAVENOUS | Status: AC
  Administered 2012-05-05: 500 mL via INTRAVENOUS

## 2012-05-05 MED ORDER — BRIMONIDINE TARTRATE 0.2 % OP SOLN
1.0000 [drp] | Freq: Two times a day (BID) | OPHTHALMIC | Status: DC
  Administered 2012-05-05 – 2012-05-08 (×6): 1 [drp] via OPHTHALMIC
  Filled 2012-05-05: qty 5

## 2012-05-05 MED ORDER — NYSTATIN 100000 UNIT/GM EX POWD
Freq: Every morning | CUTANEOUS | Status: DC
  Administered 2012-05-06 – 2012-05-08 (×3): via TOPICAL
  Filled 2012-05-05: qty 15

## 2012-05-05 MED ORDER — FLUTICASONE PROPIONATE HFA 44 MCG/ACT IN AERO
1.0000 | INHALATION_SPRAY | Freq: Two times a day (BID) | RESPIRATORY_TRACT | Status: DC
  Administered 2012-05-06 – 2012-05-08 (×3): 1 via RESPIRATORY_TRACT
  Filled 2012-05-05 (×2): qty 10.6

## 2012-05-05 MED ORDER — MORPHINE SULFATE 2 MG/ML IJ SOLN: 2.0000 mg | INTRAMUSCULAR | Status: DC | PRN

## 2012-05-05 MED ORDER — BIMATOPROST 0.01 % OP SOLN
1.0000 [drp] | Freq: Every day | OPHTHALMIC | Status: DC
  Administered 2012-05-05 – 2012-05-07 (×3): 1 [drp] via OPHTHALMIC
  Filled 2012-05-05: qty 2.5

## 2012-05-05 MED ORDER — SODIUM CHLORIDE 0.9 % IV SOLN: 250.0000 mL | INTRAVENOUS | Status: DC | PRN

## 2012-05-05 MED ORDER — VITAMIN B-12 1000 MCG PO TABS
1000.0000 ug | ORAL_TABLET | Freq: Every day | ORAL | Status: DC
  Administered 2012-05-05 – 2012-05-08 (×4): 1000 ug via ORAL
  Filled 2012-05-05 (×4): qty 1

## 2012-05-05 MED ORDER — OXYCODONE HCL 5 MG PO TABS: 5.0000 mg | ORAL_TABLET | ORAL | Status: DC | PRN

## 2012-05-05 MED ORDER — SODIUM CHLORIDE 0.9 % IV SOLN
INTRAVENOUS | Status: DC
  Administered 2012-05-05 – 2012-05-07 (×4): via INTRAVENOUS

## 2012-05-05 MED ORDER — POLYVINYL ALCOHOL 1.4 % OP SOLN
1.0000 [drp] | Freq: Four times a day (QID) | OPHTHALMIC | Status: DC
  Administered 2012-05-05 – 2012-05-08 (×12): 1 [drp] via OPHTHALMIC
  Filled 2012-05-05 (×2): qty 15

## 2012-05-05 MED ORDER — BRIMONIDINE TARTRATE-TIMOLOL 0.2-0.5 % OP SOLN: 1.0000 [drp] | Freq: Two times a day (BID) | OPHTHALMIC | Status: DC

## 2012-05-05 MED ORDER — SODIUM CHLORIDE 0.9 % IJ SOLN
3.0000 mL | Freq: Two times a day (BID) | INTRAMUSCULAR | Status: DC
  Administered 2012-05-07: 3 mL via INTRAVENOUS

## 2012-05-05 NOTE — ED Notes (Signed)
Per EMS- Pt from [facility], this morning pt was difficult to arouse, wasn't responding like usual, pt feels warm to touch. Hx of UTI, staff reporting recent black tarry stool. No neuro deficits noted. Weakness in left foot noted. 110/68, HR 98, CBG 95. SR on monitor, Pt has hx dementia, pt responds to questions.

## 2012-05-05 NOTE — H&P (Signed)
PCP:   Astrid Divine, MD, MD   Chief Complaint:  Altered mental status.  HPI: This is an 78 year old female, resident of [facility] ALF, with known history of profound dementia, peripheral neuropathy, DJD, cervical stenosis/lumbar stenosis, vitamin B12 deficiency, OSA, COPD, atrial fibrillation, not considered coumadin candidate, due to dementia and anemia, GERD, diverticulosis, glaucoma, dyslipidemia, hypothyroidism, chronic iron deficiency anemia. EGD/Colonoscopy offered by [facility] in 04/2011, but declined by patient's son/HPOA, Sharon Fletcher, who did not want aggressive/invasive work up. She is unable to contribute to history, in view of severe dementia and altered mental status, so history was gleaned from ED MD. It appears that for the past few days, patient has become increasingly confused beyond baseline, worse in the last couple of days. She was brought to ED on suspicion of a possible UTI, but was found to be significantly hypotensive, with SBP in the 70s. She was bolused with iv NS, and SBP is now 103. She had reportedly, been treated with a 10-day course of Ciprofloxacin, from 04/13/12.   Allergies:  No Known Allergies    Past Medical History  Diagnosis Date  . Dementia   . Anemia   . Hypercholesteremia   . Peripheral neuropathy   . GERD (gastroesophageal reflux disease)   . Restless leg syndrome   . Diverticular disease   . Incontinence     History reviewed. No pertinent past surgical history.  Prior to Admission medications   Medication Sig Start Date End Date Taking? Authorizing Provider  beclomethasone (QVAR) 80 MCG/ACT inhaler Inhale 2 puffs into the lungs 2 (two) times daily.   Yes Historical Provider, MD  bimatoprost (LUMIGAN) 0.01 % SOLN Place 1 drop into both eyes at bedtime.   Yes Historical Provider, MD  brimonidine-timolol (COMBIGAN) 0.2-0.5 % ophthalmic solution Place 1 drop into both eyes every 12 (twelve) hours.   Yes Historical Provider, MD    ciprofloxacin (CIPRO) 500 MG tablet Take 500 mg by mouth 2 (two) times daily. Started 04-13-12 for 10 days   Yes Historical Provider, MD  fish oil-omega-3 fatty acids 1000 MG capsule Take 1 g by mouth 2 (two) times daily.   Yes Historical Provider, MD  Hypromellose (NATURAL BALANCE TEARS) 0.4 % SOLN Place 1 drop into both eyes 4 (four) times daily.   Yes Historical Provider, MD  nystatin (MYCOSTATIN/NYSTOP) 100000 UNIT/GM POWD Apply topically.   Yes Historical Provider, MD  olopatadine (PATANOL) 0.1 % ophthalmic solution Place 1 drop into both eyes 2 (two) times daily.   Yes Historical Provider, MD  polyethylene glycol (MIRALAX / GLYCOLAX) packet Take 17 g by mouth daily.   Yes Historical Provider, MD  risperiDONE (RISPERDAL) 1 MG tablet Take 1 mg by mouth daily.   Yes Historical Provider, MD  vitamin B-12 (CYANOCOBALAMIN) 1000 MCG tablet Take 1,000 mcg by mouth daily.   Yes Historical Provider, MD    Social History:  reports that she has never smoked. She does not have any smokeless tobacco history on file. She reports that she does not drink alcohol or use illicit drugs.  No family history on file.  Review of Systems:  As per HPI and chief complaint. Patent is pleasantly demented, mumbling to herself, and quite unable to provide meaningful answers to questions at this time. Systems review is therefore, unobtainable.  Physical Exam: General:  Patient does not appear to be in obvious acute distress. Alert, talking in complete sentences, albeit incoherently, not short of breath at rest.  HEENT:  Mild  clinical pallor, no jaundice, no conjunctival injection or discharge. Visible buccal mucosa is "dry". NECK:  Supple, JVP not seen, no carotid bruits, no palpable lymphadenopathy, no palpable goiter. CHEST:  Clinically clear to auscultation, no wheezes, no crackles. HEART:  Sounds 1 and 2 heard, normal, regular, no murmurs. ABDOMEN:  Moderately obese, soft, non-tender, no palpable organomegaly, no  palpable masses, normal bowel sounds. GENITALIA:  Not examined. LOWER EXTREMITIES:  No pitting edema, palpable peripheral pulses. MUSCULOSKELETAL SYSTEM:  Generalized osteoarthritic changes, otherwise, normal. CENTRAL NERVOUS SYSTEM:  No focal neurologic deficit on gross examination.  Labs on Admission:  Results for orders placed during the hospital encounter of 05/05/12 (from the past 48 hour(s))  URINALYSIS, ROUTINE W REFLEX MICROSCOPIC     Status: Abnormal   Collection Time   05/05/12 10:47 AM      Component Value Range Comment   Color, Urine YELLOW  YELLOW     APPearance CLEAR  CLEAR     Specific Gravity, Urine 1.011  1.005 - 1.030     pH 7.0  5.0 - 8.0     Glucose, UA NEGATIVE  NEGATIVE (mg/dL)    Hgb urine dipstick NEGATIVE  NEGATIVE     Bilirubin Urine NEGATIVE  NEGATIVE     Ketones, ur NEGATIVE  NEGATIVE (mg/dL)    Protein, ur NEGATIVE  NEGATIVE (mg/dL)    Urobilinogen, UA 2.0 (*) 0.0 - 1.0 (mg/dL)    Nitrite NEGATIVE  NEGATIVE     Leukocytes, UA NEGATIVE  NEGATIVE  MICROSCOPIC NOT DONE ON URINES WITH NEGATIVE PROTEIN, BLOOD, LEUKOCYTES, NITRITE, OR GLUCOSE <1000 mg/dL.  CBC     Status: Abnormal   Collection Time   05/05/12 10:48 AM      Component Value Range Comment   WBC 7.4  4.0 - 10.5 (K/uL) WHITE COUNT CONFIRMED ON SMEAR   RBC 4.14  3.87 - 5.11 (MIL/uL)    Hemoglobin 10.4 (*) 12.0 - 15.0 (g/dL)    HCT 45.4 (*) 09.8 - 46.0 (%)    MCV 80.7  78.0 - 100.0 (fL)    MCH 25.1 (*) 26.0 - 34.0 (pg)    MCHC 31.1  30.0 - 36.0 (g/dL)    RDW 11.9 (*) 14.7 - 15.5 (%)    Platelets    150 - 400 (K/uL)    Value: PLATELET CLUMPS NOTED ON SMEAR, COUNT APPEARS ADEQUATE  DIFFERENTIAL     Status: Abnormal   Collection Time   05/05/12 10:48 AM      Component Value Range Comment   Neutrophils Relative 49  43 - 77 (%)    Lymphocytes Relative 35  12 - 46 (%)    Monocytes Relative 9  3 - 12 (%)    Eosinophils Relative 6 (*) 0 - 5 (%)    Basophils Relative 1  0 - 1 (%)    Neutro Abs 3.6   1.7 - 7.7 (K/uL)    Lymphs Abs 2.6  0.7 - 4.0 (K/uL)    Monocytes Absolute 0.7  0.1 - 1.0 (K/uL)    Eosinophils Absolute 0.4  0.0 - 0.7 (K/uL)    Basophils Absolute 0.1  0.0 - 0.1 (K/uL)    RBC Morphology ELLIPTOCYTES   BURR CELLS  BASIC METABOLIC PANEL     Status: Abnormal   Collection Time   05/05/12 10:48 AM      Component Value Range Comment   Sodium 136  135 - 145 (mEq/L)    Potassium 3.9  3.5 - 5.1 (mEq/L)  Chloride 104  96 - 112 (mEq/L)    CO2 18 (*) 19 - 32 (mEq/L)    Glucose, Bld 81  70 - 99 (mg/dL)    BUN 13  6 - 23 (mg/dL)    Creatinine, Ser 1.61 (*) 0.50 - 1.10 (mg/dL)    Calcium 8.9  8.4 - 10.5 (mg/dL)    GFR calc non Af Amer >90  >90 (mL/min)    GFR calc Af Amer >90  >90 (mL/min)   LACTIC ACID, PLASMA     Status: Normal   Collection Time   05/05/12 10:49 AM      Component Value Range Comment   Lactic Acid, Venous 1.5  0.5 - 2.2 (mmol/L)   OCCULT BLOOD, POC DEVICE     Status: Normal   Collection Time   05/05/12 10:54 AM      Component Value Range Comment   Fecal Occult Bld NEGATIVE       Radiological Exams on Admission: No results found.  Assessment/Plan Principal Problem:  *Hypotension: Precise etiology is unclear at this time, although patient does appear to be at least mildly dehydrated, clinically. Suspect the etiology is volume depletion, due to poor oral intake, although there is no history of diarrhea, vomiting, diuretic or antibiotic use. Will manage with ivi fluids. Patient has already responded well to iv saline boluses, administered in the ED. As of now, SBP is 108. For completeness, we shall check D-Dimer, and cycle cardiac enzymes. Patient does not appear septic, but will carry out septic work up and check procalcitonin. Patient did have recent antibiotic treatment for UTI, so we shall check stool for C. Diff.  Active Problems:  1. Altered mental status: This was described as lethargy, which was not evident at the time of my evaluation, possibly as a  result of improved hydration. Will complete septic work up as described above, including blood cultures and CXR. Urinalysis is negative. Patient does have a known history of severe dementia.  2. Anemia: At present, patient has a mild normocytic anemia of 10.4. She has had blood transfusions in the past, perhaps as recently as on 04/24/12, per EMR. [facility] offered endoscopic evaluation in May 2012, for iron deficiency, but HPOA did not want invasive evaluation, at that time. She also has known vitamin B12 deficiency. Be that as it may, hemoglobin appears reasonable at this time.  3. Hypothyroidism: Patient is on re[placement therapy, which we shall continue.  4. Dementia ; See # 1. Will continue pre-admission psychotropics, check B12, Folate and TSH levels.  5. Atrial fibrillation: Patient has a known history of PAF and is not considered a candidate for anticoagulation, due to profound dementia, fall risk, and anemia. She is currently in sinus rhythm.   Further management will depend on clinical course.   Comment: Patient may require a higher level of care, ie , SNF, on discharge. Patient is DNR/DNI.  Time Spent on Admission: 1 hour.  D'Arcy Abraha,CHRISTOPHER 05/05/2012, 1:08 PM

## 2012-05-05 NOTE — Progress Notes (Signed)
Pt's D-dimer is 3.62.  Dr. Brien Few notified via text page per MD instruction.

## 2012-05-05 NOTE — Progress Notes (Signed)
Pt admitted to unit 5500 on 05/05/12 after presenting to the ED with altered mental status and confusion.  Pt is from [facility] nursing home and is oriented to self only.  Pt has a history of dementia, but follows commands.  Pt oriented to unit and call bell system, safety video viewed.  Yellow arm band and red socks placed on pt, fall precautions initiated.  Pt is on telemetry running NSR.  Pt's bottom is intact with no redness or breakdown.  Skin under breasts is red and moist bilaterally.  Will treat per MD orders.

## 2012-05-05 NOTE — ED Notes (Signed)
Pt son, [name], [phone]. Pt awaiting for admitting MD

## 2012-05-05 NOTE — ED Provider Notes (Signed)
History     CSN: [id number]  Arrival date & time 05/05/12  1023   First MD Initiated Contact with Patient 05/05/12 1029      Chief Complaint  Patient presents with  . Altered Mental Status    Patient is a 78 y.o. female presenting with altered mental status. The history is provided by the patient and the EMS personnel. The history is limited by the condition of the patient.  Altered Mental Status This is a new problem. Episode onset: unknown time ago. The problem occurs constantly. The problem has been gradually improving. Pertinent negatives include no shortness of breath. The symptoms are aggravated by nothing. The symptoms are relieved by nothing. She has tried nothing for the symptoms. The treatment provided mild relief.  pt presents from nursing facility for altered mental status Per EMS, pt is usually more alert but nursing staff more lethargy Also noted black stool No vomiting No fever Has h/o recurrent uti Pt is awake/alert, and has no new complaints  Past Medical History  Diagnosis Date  . Dementia   . Anemia   . Hypercholesteremia   . Peripheral neuropathy   . GERD (gastroesophageal reflux disease)   . Restless leg syndrome   . Diverticular disease   . Incontinence     History reviewed. No pertinent past surgical history.  No family history on file.  History  Substance Use Topics  . Smoking status: Never Smoker   . Smokeless tobacco: Not on file  . Alcohol Use: No    OB History    Grav Para Term Preterm Abortions TAB SAB Ect Mult Living                  Review of Systems  Unable to perform ROS: Mental status change  Respiratory: Negative for shortness of breath.   Psychiatric/Behavioral: Positive for altered mental status.    Allergies  Review of patient's allergies indicates no known allergies.  Home Medications   Current Outpatient Rx  Name Route Sig Dispense Refill  . BECLOMETHASONE DIPROPIONATE 80 MCG/ACT IN AERS Inhalation Inhale 2  puffs into the lungs 2 (two) times daily.    Marland Kitchen BIMATOPROST 0.01 % OP SOLN Both Eyes Place 1 drop into both eyes at bedtime.    Marland Kitchen BRIMONIDINE TARTRATE-TIMOLOL 0.2-0.5 % OP SOLN Both Eyes Place 1 drop into both eyes every 12 (twelve) hours.    Marland Kitchen CIPROFLOXACIN HCL 500 MG PO TABS Oral Take 500 mg by mouth 2 (two) times daily. Started 04-13-12 for 10 days    . OMEGA-3 FATTY ACIDS 1000 MG PO CAPS Oral Take 1 g by mouth 2 (two) times daily.    Marland Kitchen HYPROMELLOSE 0.4 % OP SOLN Both Eyes Place 1 drop into both eyes 4 (four) times daily.    . NYSTATIN 100000 UNIT/GM EX POWD Topical Apply topically.    . OLOPATADINE HCL 0.1 % OP SOLN Both Eyes Place 1 drop into both eyes 2 (two) times daily.    Marland Kitchen POLYETHYLENE GLYCOL 3350 PO PACK Oral Take 17 g by mouth daily.    Marland Kitchen RISPERIDONE 1 MG PO TABS Oral Take 1 mg by mouth daily.    Marland Kitchen VITAMIN B-12 1000 MCG PO TABS Oral Take 1,000 mcg by mouth daily.      BP 96/47  Pulse 73  Temp(Src) 97.8 F (36.6 C) (Rectal)  Resp 16  SpO2 100% BP 89/55  Pulse 80  Temp(Src) 97.8 F (36.6 C) (Rectal)  Resp 17  SpO2 100%  Physical Exam CONSTITUTIONAL: Well developed/well nourished HEAD AND FACE: Normocephalic/atraumatic EYES: EOMI/PERRL ENMT: Mucous membranes dry NECK: supple no meningeal signs SPINE:entire spine nontender CV: S1/S2 noted LUNGS: Lungs are clear to auscultation bilaterally, no apparent distress ABDOMEN: soft, nontender, no rebound or guarding GU:no cva tenderness NEURO: Pt is awake/alert, moves all extremitiesx4, pleasantly demented EXTREMITIES: pulses normal, full ROM SKIN: warm, color normal PSYCH: no abnormalities of mood noted  ED Course  Procedures    Labs Reviewed  OCCULT BLOOD, POC DEVICE  CBC  DIFFERENTIAL  BASIC METABOLIC PANEL  URINALYSIS, ROUTINE W REFLEX MICROSCOPIC  LACTIC ACID, PLASMA  11:53 AM Spoke to son, she has had more confusion lately, more agitated but she does have h/o dementia She is awake/alert, maex4 She is a  DNR She is dehydration and has had some drops in BP Will continue hydration and likely admit  D/w dr Brien Few to see in the ED Pt stabilized   MDM  Nursing notes reviewed and considered in documentation All labs/vitals reviewed and considered        Date: 05/05/2012  Rate: 72  Rhythm: normal sinus rhythm  QRS Axis: normal  Intervals: normal  ST/T Wave abnormalities: nonspecific ST changes  Conduction Disutrbances:none  Narrative Interpretation:   Old EKG Reviewed: none available at time of interpretation    Joya Gaskins, MD 05/05/12 1231

## 2012-05-06 DIAGNOSIS — R112 Nausea with vomiting, unspecified: Secondary | ICD-10-CM

## 2012-05-06 DIAGNOSIS — I959 Hypotension, unspecified: Secondary | ICD-10-CM

## 2012-05-06 DIAGNOSIS — R404 Transient alteration of awareness: Secondary | ICD-10-CM

## 2012-05-06 DIAGNOSIS — E032 Hypothyroidism due to medicaments and other exogenous substances: Secondary | ICD-10-CM

## 2012-05-06 LAB — CARDIAC PANEL(CRET KIN+CKTOT+MB+TROPI)
CK, MB: 2.7 ng/mL (ref 0.3–4.0)
Relative Index: INVALID (ref 0.0–2.5)
Relative Index: INVALID (ref 0.0–2.5)
Total CK: 54 U/L (ref 7–177)
Total CK: 45 U/L (ref 7–177)
Troponin I: <0.3 ng/mL (ref <0.30)

## 2012-05-06 LAB — CBC
MCH: 25 pg — ABNORMAL LOW (ref 26.0–34.0)
MCV: 80.3 fL (ref 78.0–100.0)
Platelets: 215 10*3/uL (ref 150–400)
RBC: 3.76 MIL/uL — ABNORMAL LOW (ref 3.87–5.11)
RDW: 18.7 % — ABNORMAL HIGH (ref 11.5–15.5)
WBC: 7.5 10*3/uL (ref 4.0–10.5)

## 2012-05-06 LAB — COMPREHENSIVE METABOLIC PANEL
AST: 9 U/L (ref 0–37)
Albumin: 2.4 g/dL — ABNORMAL LOW (ref 3.5–5.2)
Alkaline Phosphatase: 70 U/L (ref 39–117)
BUN: 10 mg/dL (ref 6–23)
CO2: 20 mEq/L (ref 19–32)
Chloride: 107 mEq/L (ref 96–112)
Creatinine, Ser: 0.42 mg/dL — ABNORMAL LOW (ref 0.50–1.10)
GFR calc non Af Amer: >90 mL/min (ref >90)
Potassium: 3.9 mEq/L (ref 3.5–5.1)
Total Bilirubin: 0.4 mg/dL (ref 0.3–1.2)

## 2012-05-06 LAB — VITAMIN B12: Vitamin B-12: 1075 pg/mL — ABNORMAL HIGH (ref 211–911)

## 2012-05-06 MED ORDER — ACETAMINOPHEN 500 MG PO TABS
1000.0000 mg | ORAL_TABLET | Freq: Three times a day (TID) | ORAL | Status: DC
  Administered 2012-05-06 – 2012-05-08 (×5): 1000 mg via ORAL
  Filled 2012-05-06 (×10): qty 2

## 2012-05-06 MED ORDER — HEPARIN SODIUM (PORCINE) 5000 UNIT/ML IJ SOLN
5000.0000 [IU] | Freq: Three times a day (TID) | INTRAMUSCULAR | Status: DC
  Administered 2012-05-06 – 2012-05-08 (×7): 5000 [IU] via SUBCUTANEOUS
  Filled 2012-05-06 (×9): qty 1

## 2012-05-06 MED ORDER — SODIUM CHLORIDE 0.9 % IV SOLN
3.0000 g | Freq: Three times a day (TID) | INTRAVENOUS | Status: DC
  Administered 2012-05-06 – 2012-05-08 (×6): 3 g via INTRAVENOUS
  Filled 2012-05-06 (×10): qty 3

## 2012-05-06 MED ORDER — VANCOMYCIN HCL 1000 MG IV SOLR
1250.0000 mg | Freq: Once | INTRAVENOUS | Status: AC
  Administered 2012-05-06: 1250 mg via INTRAVENOUS
  Filled 2012-05-06: qty 1250

## 2012-05-06 MED ORDER — VANCOMYCIN HCL 1000 MG IV SOLR
750.0000 mg | Freq: Two times a day (BID) | INTRAVENOUS | Status: DC
  Administered 2012-05-06 – 2012-05-07 (×2): 750 mg via INTRAVENOUS
  Filled 2012-05-06 (×2): qty 750

## 2012-05-06 NOTE — Evaluation (Signed)
Physical Therapy Evaluation Patient Details Name: Sharon Fletcher MRN: [id number] DOB: 07-Jun-1929 Today's Date: 05/06/2012 Time: 4098-1191 PT Time Calculation (min): 18 min  PT Assessment / Plan / Recommendation Clinical Impression  Pt adm with hypotension from ALF.  Pt with poor mobility this AM.  Expect with pt's dementia, the unfamiliar environment and pt's fear of falling are contributing to decr mobility.  CSW to speak to ALF to see what level of assistance they can provide.  If ALF not able to provide amount of assist pt needs then will need SNF.    PT Assessment  Patient needs continued PT services    Follow Up Recommendations  Home health PT;Skilled nursing facility (if ALF can provide amount of assist pt needs)    Barriers to Discharge        lEquipment Recommendations  Defer to next venue    Recommendations for Other Services     Frequency Min 3X/week    Precautions / Restrictions Precautions Precautions: Fall Precaution Comments: ALF reports no recent falls but pt very fearful of all mobility. Restrictions Weight Bearing Restrictions: No   Pertinent Vitals/Pain Rt hand pain      Mobility  Bed Mobility Bed Mobility: Supine to Sit;Sit to Supine;Sitting - Scoot to Edge of Bed Supine to Sit: 1: +2 Total assist;HOB flat Supine to Sit: Patient Percentage: 10% Sitting - Scoot to Edge of Bed: 1: +1 Total assist Sit to Supine: 1: +1 Total assist;HOB flat Details for Bed Mobility Assistance: Pt fearful of all mobility.  Pt very resistant to all movement, therefore was dependent with all mobility today. Transfers Details for Transfer Assistance: Attempted sit to stand with +2 total assist but pt unable.  Effort appeared limited by fear of falling.    Exercises     PT Diagnosis: Difficulty walking;Generalized weakness  PT Problem List: Decreased mobility;Decreased activity tolerance;Decreased strength PT Treatment Interventions: DME instruction;Gait  training;Functional mobility training;Patient/family education;Therapeutic activities;Therapeutic exercise;Balance training   PT Goals Acute Rehab PT Goals PT Goal Formulation: Patient unable to participate in goal setting Time For Goal Achievement: 05/20/12 Potential to Achieve Goals: Fair Pt will go Supine/Side to Sit: with min assist PT Goal: Supine/Side to Sit - Progress: Goal set today Pt will go Sit to Supine/Side: with min assist PT Goal: Sit to Supine/Side - Progress: Goal set today Pt will go Sit to Stand: with min assist PT Goal: Sit to Stand - Progress: Goal set today Pt will go Stand to Sit: with min assist PT Goal: Stand to Sit - Progress: Goal set today Pt will Ambulate: 16 - 50 feet;with min assist;with least restrictive assistive device PT Goal: Ambulate - Progress: Goal set today  Visit Information  Last PT Received On: 05/06/12 Assistance Needed: +2 PT/OT Co-Evaluation/Treatment: Yes    Subjective Data  Subjective: "I use a cane," pt stated when asked if she used anything to walk.  After talking to [facility] found that pt actually uses a rolling walker.` Patient Stated Goal: Pt unable to state due to dementia.   Prior Functioning  Home Living Lives With: Other (Comment) [facility]) Available Help at Discharge: Available 24 hours/day;Other (Comment) (ALF staff) Type of Home: Assisted living Home Access: Level entry Home Layout: One level Bathroom Shower/Tub: Walk-in Contractor: Handicapped height Bathroom Accessibility: Yes How Accessible: Accessible via wheelchair Home Adaptive Equipment: Bedside commode/3-in-1;Shower chair with back;Walker - rolling Prior Function Level of Independence: Needs assistance Needs Assistance: Bathing;Dressing;Grooming;Toileting;Meal Prep;Light Housekeeping Bath: Total Dressing: Total Grooming:  Supervision/set-up Toileting: Total Meal Prep: Total Light Housekeeping: Total Able to Take  Stairs?: No Driving: No Vocation: Retired Musician: Expressive difficulties;Receptive difficulties;Other (comment) (pt w significant dementia.) Dominant Hand:  (unsure.  By observation, probably Right.)    Cognition  Overall Cognitive Status: History of cognitive impairments - at baseline (cognitive impairments are worse than baseline per son.) Arousal/Alertness: Awake/alert Orientation Level: Disoriented to;Place;Time;Situation Behavior During Session: WFL for tasks performed Cognition - Other Comments: PT with profound dementia.  Pt with difficulty followoing all commands and has poor low and high level cognitive skills.    Extremity/Trunk Assessment Right Upper Extremity Assessment RUE ROM/Strength/Tone: Within functional levels (except for R base of thumb limited 2 to pain.) RUE Sensation: WFL - Light Touch RUE Coordination: WFL - gross/fine motor Left Upper Extremity Assessment LUE ROM/Strength/Tone: WFL for tasks assessed LUE Sensation: WFL - Light Touch LUE Coordination: WFL - gross/fine motor Right Lower Extremity Assessment RLE ROM/Strength/Tone: Deficits;Due to impaired cognition RLE ROM/Strength/Tone Deficits: Pt appears fearful of moving.  Did note active movement against gravity. Left Lower Extremity Assessment LLE ROM/Strength/Tone: Deficits;Due to impaired cognition LLE ROM/Strength/Tone Deficits: Pt appears fearful of moving.  Did note active movement against gravity.   Balance Balance Balance Assessed: No Static Sitting Balance Static Sitting - Balance Support: No upper extremity supported Static Sitting - Level of Assistance: 5: Stand by assistance  End of Session PT - End of Session Equipment Utilized During Treatment: Gait belt Activity Tolerance: Patient tolerated treatment well Patient left: in bed;with call bell/phone within reach;with bed alarm set Nurse Communication: Mobility status   Riana Tessmer 05/06/2012, 10:25 AM  Skip Mayer  PT [phone]

## 2012-05-06 NOTE — Plan of Care (Signed)
Problem: Phase I Progression Outcomes Goal: OOB as tolerated unless otherwise ordered Outcome: Progressing PT eval in progress. Pt afraid of ambulating d/t risk of falling Goal: Voiding-avoid urinary catheter unless indicated Outcome: Not Progressing Foley in place

## 2012-05-06 NOTE — Evaluation (Signed)
Occupational Therapy Evaluation Patient Details Name: Sharon Fletcher MRN: [id number] DOB: April 11, 1929 Today's Date: 05/06/2012 Time: 0850-0900 OT Time Calculation (min): 10 min  OT Assessment / Plan / Recommendation Clinical Impression  Pt admitted for hypotension and increased confusion beyond baseline affecting pts ability to complete basic mobility.  Pt was previously failry dependent with basic adls outside of feeding and grooming.  Rec pt receive HHOT at d/c at ALF.    OT Assessment  All further OT needs can be met in the next venue of care    Follow Up Recommendations  Home health OT    Barriers to Discharge      Equipment Recommendations  None recommended by OT    Recommendations for Other Services    Frequency       Precautions / Restrictions Precautions Precautions: Fall Precaution Comments: ALF reports no recent falls but pt very fearful of all mobility. Restrictions Weight Bearing Restrictions: No   Pertinent Vitals/Pain Pt grimaced with movement of R hand at base of thumb.    ADL  Eating/Feeding: Performed;Set up Where Assessed - Eating/Feeding: Bed level Grooming: Simulated;Minimal assistance Where Assessed - Grooming: Supine, head of bed up Upper Body Bathing: Simulated;+1 Total assistance Where Assessed - Upper Body Bathing: Supine, head of bed up Lower Body Bathing: Simulated;+1 Total assistance Where Assessed - Lower Body Bathing: Supine, head of bed up Upper Body Dressing: Simulated;+1 Total assistance Where Assessed - Upper Body Dressing: Supine, head of bed up Lower Body Dressing: Simulated;+1 Total assistance Where Assessed - Lower Body Dressing: Supine, head of bed up Toilet Transfer: Other (comment) (pt only uses depends at ALF.  Nursing changes her in bed.) Transfers/Ambulation Related to ADLs: Pt resistant to all standing. ADL Comments: PT dependent prior to admit for most adls.    OT Diagnosis: Cognitive deficits;Generalized  weakness;Acute pain  OT Problem List: Decreased activity tolerance;Decreased cognition;Decreased safety awareness;Pain OT Treatment Interventions:     OT Goals    Visit Information  Last OT Received On: 05/06/12 Assistance Needed: +2 PT/OT Co-Evaluation/Treatment: Yes    Subjective Data  Subjective: Pt unable to communicate a complete thought. Patient Stated Goal: not able to state.   Prior Functioning  Home Living Lives With: Other (Comment) [facility]) Available Help at Discharge: Available 24 hours/day;Other (Comment) (ALF staff) Type of Home: Assisted living Home Access: Level entry Home Layout: One level Bathroom Shower/Tub: Walk-in Contractor: Handicapped height Bathroom Accessibility: Yes How Accessible: Accessible via wheelchair Home Adaptive Equipment: Bedside commode/3-in-1;Shower chair with back;Walker - rolling Prior Function Level of Independence: Needs assistance Needs Assistance: Bathing;Dressing;Grooming;Toileting;Meal Prep;Light Housekeeping Bath: Total Dressing: Total Grooming: Supervision/set-up Toileting: Total Meal Prep: Total Light Housekeeping: Total Able to Take Stairs?: No Driving: No Vocation: Retired Musician: Expressive difficulties;Receptive difficulties;Other (comment) (pt w significant dementia.) Dominant Hand:  (unsure.  By observation, probably Right.)    Cognition  Overall Cognitive Status: History of cognitive impairments - at baseline (cognitive impairments are worse than baseline per son.) Arousal/Alertness: Awake/alert Orientation Level: Disoriented to;Place;Time;Situation Behavior During Session: WFL for tasks performed Cognition - Other Comments: PT with profound dementia.  Pt with difficulty followoing all commands and has poor low and high level cognitive skills.    Extremity/Trunk Assessment Right Upper Extremity Assessment RUE ROM/Strength/Tone: Within functional levels  (except for R base of thumb limited 2 to pain.) RUE Sensation: WFL - Light Touch RUE Coordination: WFL - gross/fine motor Left Upper Extremity Assessment LUE ROM/Strength/Tone: WFL for tasks assessed LUE Sensation: WFL -  Light Touch LUE Coordination: WFL - gross/fine motor   Mobility Bed Mobility Bed Mobility: Supine to Sit;Sit to Supine;Sitting - Scoot to Edge of Bed Supine to Sit: 1: +2 Total assist;HOB flat Supine to Sit: Patient Percentage: 10% Sitting - Scoot to Edge of Bed: 1: +1 Total assist Sit to Supine: 1: +1 Total assist;HOB flat Details for Bed Mobility Assistance: Pt fearful of all mobility.  Pt very resistant to all movement, therefore was dependent with all mobility today. Transfers Transfers: Not assessed   Exercise    Balance Balance Balance Assessed: No  End of Session OT - End of Session Equipment Utilized During Treatment: Gait belt Activity Tolerance: Patient limited by pain;Other (comment) (limited by fear of mobility.) Patient left: in bed;with bed alarm set;with call bell/phone within reach;Other (comment) (phlebotomist in room.) Nurse Communication: Mobility status   Hope Budds 05/06/2012, 9:29 AM [phone]

## 2012-05-06 NOTE — Progress Notes (Signed)
ANTIBIOTIC CONSULT NOTE - INITIAL  Pharmacy Consult for vancomycin and unasyn Indication: rule out pneumonia  No Known Allergies  Patient Measurements: Height: 5\' 4"  (162.6 cm) Weight: 166 lb 11.2 oz (75.615 kg) IBW/kg (Calculated) : 54.7   Vital Signs: Temp: 99.5 F (37.5 C) (05/23 0538) Temp src: Oral (05/23 0538) BP: 106/68 mmHg (05/23 0538) Pulse Rate: 93  (05/23 0538) Intake/Output from previous day: 05/22 0701 - 05/23 0700 In: 432.5 [P.O.:120; I.V.:312.5] Out: 1100 [Urine:1100] Intake/Output from this shift: Total I/O In: 236 [P.O.:236] Out: -   Labs:  Basename 05/06/12 0856 05/05/12 1048  WBC 7.5 7.4  HGB 9.4* 10.4*  PLT 215 PLATELET CLUMPS NOTED ON SMEAR, COUNT APPEARS ADEQUATE  LABCREA -- --  CREATININE 0.42* 0.38*   Estimated Creatinine Clearance: 54.9 ml/min (by C-G formula based on Cr of 0.42).  Microbiology: Recent Results (from the past 720 hour(s))  MRSA PCR SCREENING     Status: Normal   Collection Time   05/05/12  8:32 PM      Component Value Range Status Comment   MRSA by PCR NEGATIVE  NEGATIVE  Final     Medical History: Past Medical History  Diagnosis Date  . Dementia   . Anemia   . Hypercholesteremia   . Peripheral neuropathy   . GERD (gastroesophageal reflux disease)   . Restless leg syndrome   . Diverticular disease   . Incontinence     Medications:  Scheduled:    . bimatoprost  1 drop Both Eyes QHS  . brimonidine  1 drop Both Eyes Q12H  . fluticasone  1 puff Inhalation BID  . nystatin   Topical q morning - 10a  . olopatadine  1 drop Both Eyes BID  . omega-3 acid ethyl esters  1 g Oral BID  . polyethylene glycol  17 g Oral Daily  . polyvinyl alcohol  1 drop Both Eyes QID  . risperiDONE  1 mg Oral Daily  . sodium chloride  500 mL Intravenous Once  . sodium chloride  500 mL Intravenous Once  . sodium chloride  3 mL Intravenous Q12H  . sodium chloride  3 mL Intravenous Q12H  . timolol  1 drop Both Eyes Q12H  . vitamin  B-12  1,000 mcg Oral Daily  . DISCONTD: brimonidine-timolol  1 drop Both Eyes Q12H  . DISCONTD: fish oil-omega-3 fatty acids  1 g Oral BID  . DISCONTD: Hypromellose  1 drop Both Eyes QID   Assessment: Ms. Arenson is an 78 year old female with multiple comorbidities admitted for altered mental status from her nursing home. WBC 7.4 with 49% neutrophils and afebrile. She is to begin vancomycin and Unasyn therapy for possible infectious source of AMS. Estimated creatinine clearance ~40-50. Will load with one dose of vancomycin aimed for peak ~25 and then dose 750 mg every 12 hours IV thereafter. Unasyn will be dosed for sepsis at 3 grams IV every 8 hours.  Goal of Therapy:  Vancomycin trough level 15-20 mcg/ml  Plan:  1. Vancomycin 1250 mg IV now 2. Vancomycin 750 mg IV every 12 hours 3. Unasyn 3 grams IV every 8 hours 4. F/U Scr, WBC, Temperature, Blood cultures  Sriyan Cutting, [place] R 05/06/2012,10:30 AM

## 2012-05-06 NOTE — Clinical Social Work Psychosocial (Signed)
     Clinical Social Work Department BRIEF PSYCHOSOCIAL ASSESSMENT 05/06/2012  Patient:  Sharon Fletcher, Sharon Fletcher     Account Number:  [id number]     Admit date:  05/05/2012  Clinical Social Worker:  Jacelyn Grip  Date/Time:  05/06/2012 11:00 AM  Referred by:  Physician  Date Referred:  05/06/2012 Referred for  SNF Placement   Other Referral:   Interview type:  Family Other interview type:    PSYCHOSOCIAL DATA Living Status:  FACILITY Admitted from facility:  [facility] Level of care:  Assisted Living Primary support name:  [name] [phone] Primary support relationship to patient:  CHILD, ADULT Degree of support available:   son    CURRENT CONCERNS Current Concerns  Post-Acute Placement   Other Concerns:    SOCIAL WORK ASSESSMENT / PLAN CSW met with pt son in hallway as RN was giving pt medications. Pt son discussed that pt is currently a resident at [facility]F. CSW explored the amount of care pt was receiving at [facility] and pt son discussed that facility assisted with bathing and dressing and pt was able to walk with rolling walker, but pt son feels that pt needs higher level of care at this time. CSW discussed process of SNF placement and insurance coverage for SNF. Pt son agreeable to SNF search in [place]. Pt son expressed interest in speaking to Palliative Medicine Team as per pt son report, pt had previously been evaluated by hospice and pt son feels that pt would benefit from hospice and palliative care. CSW notified MD of pt son request to speak to PMT. CSW completed FL2 and initiated SNF search to [facility]. Pt is currently observation and RNCM submitting utilization review to ensure pt meeting inpatient criteria. CSW to follow up with pt son in regard to bed offers. CSW to facilitate pt discharge needs when pt medically stable for discharge and bed available at SNF.   Assessment/plan status:  Psychosocial  Support/Ongoing Assessment of Needs Other assessment/ plan:   discharge planning   Information/referral to community resources:   Per RN, pt alert and oriented to person only. Pt son is supportive and actively involved in pt care. CSW provided supportive counseling as pt son discussed recent stressors. Pt son plans to explore facilities today.    PATIENTS/FAMILYS RESPONSE TO PLAN OF CARE:

## 2012-05-06 NOTE — Progress Notes (Signed)
Palliative Medicine Team consult for goals of care requested by Dr Jerral Ralph; spoke with patient's son Hamilton Capri ([phone]) meeting scheduled for tomorrow, Friday 05/07/12 @ 5:30 pm -as he will be come to hospital directly from work.   Valente David, RN 05/06/2012, 12:20 PM Palliative Medicine Team RN Liaison [phone]

## 2012-05-06 NOTE — Progress Notes (Signed)
PATIENT DETAILS Name: Sharon Fletcher Age: 78 y.o. Sex: female Date of Birth: 1929/01/21 Admit Date: 05/05/2012 ZOX:[id number],UJWJXB COLLINS, MD, MD  Subjective: Pleasantly confused  Objective: Vital signs in last 24 hours: Filed Vitals:   05/05/12 1445 05/05/12 1549 05/05/12 2123 05/06/12 0538  BP: 108/64 113/76 106/69 106/68  Pulse:  81 85 93  Temp:  97.9 F (36.6 C) 98.5 F (36.9 C) 99.5 F (37.5 C)  TempSrc:  Oral Oral Oral  Resp: 17 18 20 20   Height:  5\' 4"  (1.626 m)    Weight:  75.615 kg (166 lb 11.2 oz)    SpO2:  99% 99% 92%    Weight change:   Body mass index is 28.61 kg/(m^2).  Intake/Output from previous day:  Intake/Output Summary (Last 24 hours) at 05/06/12 1022 Last data filed at 05/06/12 0800  Gross per 24 hour  Intake  668.5 ml  Output   1100 ml  Net -431.5 ml    PHYSICAL EXAM: Gen Exam: Pleasantly confused, with clear speech.  Neck: Supple, No JVD.   Chest: B/L Clear.   CVS: S1 S2 Regular, no murmurs.  Abdomen: soft, BS +, non tender, non distended.  Extremities: no edema, lower extremities warm to touch. Neurologic: Non Focal.   Skin: No Rash.   Wounds: N/A.    CONSULTS:  None  LAB RESULTS: CBC  Lab 05/06/12 0856 05/05/12 1048  WBC 7.5 7.4  HGB 9.4* 10.4*  HCT 30.2* 33.4*  PLT 215 PLATELET CLUMPS NOTED ON SMEAR, COUNT APPEARS ADEQUATE  MCV 80.3 80.7  MCH 25.0* 25.1*  MCHC 31.1 31.1  RDW 18.7* 18.5*  LYMPHSABS -- 2.6  MONOABS -- 0.7  EOSABS -- 0.4  BASOSABS -- 0.1  BANDABS -- --    Chemistries   Lab 05/06/12 0856 05/05/12 1048  NA 137 136  K 3.9 3.9  CL 107 104  CO2 20 18*  GLUCOSE 119* 81  BUN 10 13  CREATININE 0.42* 0.38*  CALCIUM 8.2* 8.9  MG -- --    GFR Estimated Creatinine Clearance: 54.9 ml/min (by C-G formula based on Cr of 0.42).  Coagulation profile No results found for this basename: INR:5,PROTIME:5 in the last 168 hours  Cardiac Enzymes  Lab 05/06/12 0856 05/05/12 2307 05/05/12 1603  CKMB  2.0 2.7 2.9  TROPONINI <0.30 <0.30 <0.30  MYOGLOBIN -- -- --    No components found with this basename: POCBNP:3  Basename 05/05/12 1603  DDIMER 3.62*   No results found for this basename: HGBA1C:2 in the last 72 hours No results found for this basename: CHOL:2,HDL:2,LDLCALC:2,TRIG:2,CHOLHDL:2,LDLDIRECT:2 in the last 72 hours  Basename 05/05/12 1603  TSH 1.900  T4TOTAL --  T3FREE --  THYROIDAB --    Basename 05/05/12 1603  VITAMINB12 1075*  FOLATE --  FERRITIN --  TIBC --  IRON --  RETICCTPCT --   No results found for this basename: LIPASE:2,AMYLASE:2 in the last 72 hours  Urine Studies No results found for this basename: UACOL:2,UAPR:2,USPG:2,UPH:2,UTP:2,UGL:2,UKET:2,UBIL:2,UHGB:2,UNIT:2,UROB:2,ULEU:2,UEPI:2,UWBC:2,URBC:2,UBAC:2,CAST:2,CRYS:2,UCOM:2,BILUA:2 in the last 72 hours  MICROBIOLOGY: Recent Results (from the past 240 hour(s))  MRSA PCR SCREENING     Status: Normal   Collection Time   05/05/12  8:32 PM      Component Value Range Status Comment   MRSA by PCR NEGATIVE  NEGATIVE  Final     RADIOLOGY STUDIES/RESULTS: Dg Chest 2 View  05/05/2012  *RADIOLOGY REPORT*  Clinical Data: Hypotension.  Acute mental status changes.  CHEST - 2 VIEW  Comparison: None.  Findings:  Suboptimal inspiration accounts for atelectasis in the lower lobes and lingula.  Cardiac silhouette mildly enlarged. Pulmonary venous hypertension without overt edema.  Lungs otherwise clear.  No pleural effusions.  Thoracic aorta atherosclerotic. Hilar and mediastinal contours otherwise unremarkable. Degenerative changes involving the thoracic spine.  Degenerative changes in both shoulders with calcified loose bodies in the left shoulder joint.  IMPRESSION: Suboptimal inspiration accounts for atelectasis in the lower lobes and lingula.  No acute cardiopulmonary disease otherwise.  Mild cardiomegaly without pulmonary edema.  Original Report Authenticated By: Arnell Sieving, M.D.   Ct Angio Chest W/cm  &/or Wo Cm  05/05/2012  *RADIOLOGY REPORT*  Clinical Data: Hypotension  CT ANGIOGRAPHY CHEST  Technique:  Multidetector CT imaging of the chest using the standard protocol during bolus administration of intravenous contrast. Multiplanar reconstructed images including MIPs were obtained and reviewed to evaluate the vascular anatomy.  Contrast:  100 ml Omnipaque 350  Comparison: None.  Findings: Respiratory motion artifact limits the study.  No obvious filling defects in the pulmonary arterial tree to suggest acute pulmonary thromboembolism.  Negative abnormal mediastinal adenopathy.  Sub centimeter short axis diameter precarinal node.  No pericardial effusion  No pneumothorax.  Tiny pleural effusions are present.  Patchy airspace disease in the posterior basal left lower lobe. There is a branching opacity in the right middle lobe worrisome for mucoid impaction within a dilated peripheral bronchi.  This may also represent an unusual appearance of airspace disease or pulmonary mass.  He is to 0.6 x 0.9 cm on image 53.  Severe degenerative change of both glenohumeral joints. 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  IMPRESSION: No evidence of acute pulmonary thromboembolism.  Branching opacity in the right middle lobe as described which may simply represent mucoid impaction within a dilated peripheral airway.  Pulmonary mass or airspace disease to have a similar appearance.  At the minimum, 70-month follow-up is recommended to ensure resolution.  Alternatively, PET CT can be performed.  Small pleural effusions  Patchy left lower lobe airspace disease.  Original Report Authenticated By: Donavan Burnet, M.D.    MEDICATIONS: Scheduled Meds:   . bimatoprost  1 drop Both Eyes QHS  . brimonidine  1 drop Both Eyes Q12H  . fluticasone  1 puff Inhalation BID  . nystatin   Topical q morning - 10a  . olopatadine  1 drop Both Eyes BID  . omega-3 acid ethyl esters  1 g Oral BID  . polyethylene glycol   17 g Oral Daily  . polyvinyl alcohol  1 drop Both Eyes QID  . risperiDONE  1 mg Oral Daily  . sodium chloride  500 mL Intravenous Once  . sodium chloride  500 mL Intravenous Once  . sodium chloride  3 mL Intravenous Q12H  . sodium chloride  3 mL Intravenous Q12H  . timolol  1 drop Both Eyes Q12H  . vitamin B-12  1,000 mcg Oral Daily  . DISCONTD: brimonidine-timolol  1 drop Both Eyes Q12H  . DISCONTD: fish oil-omega-3 fatty acids  1 g Oral BID  . DISCONTD: Hypromellose  1 drop Both Eyes QID   Continuous Infusions:   . sodium chloride 75 mL/hr at 05/06/12 0923   PRN Meds:.sodium chloride, acetaminophen, acetaminophen, iohexol, morphine injection, ondansetron (ZOFRAN) IV, ondansetron, oxyCODONE, sodium chloride  Antibiotics: Anti-infectives    None      Assessment/Plan: Patient Active Hospital Problem List: Hypotension -not sure whether this was all just dehydration, or with some component from PNA -BP now  better with IVF resuscitiation  Altered mental status -worse than baseline -likely toxic metabolic encephalopathy from PNA -will start Antibiotics and see if she returns to her usual baseline  PNA -Health care associated-given she lives in a ALF -CT scan of Chest-shows airspace disease vs lung mass with impacted mucus-discussed with son at bedside-current plan is to treat as PNA-and if family desires to repeat CT Chest in 6-8 weeks, however after discussion with son-he desires no further work-up if this turns out to be malignant. In interim-will resume Vanco/Unasyn  Anemia -apparently had a PRBC transfusion a few weeks back -family has refused EGD/Colonoscopy in past-however no overt signs on bleeding -continue with Vitamin B12 supplementation  Arthritis -has arthralgias-in B/L hands/elbow's/shoulders/knees -start scheduled  Tylenol, given anemia-would avoid-NSAIDS  Dementia with Delirium -worsened from encephalopathy from likely HCAP -Continue with  risperidone  Atrial fibrillation -not a coumadin candidate -currently sinus in telemetry  Disposition: -remain inpatient-likely will SNF on discharge  DVT Prophylaxis: Begin Prophylactic Heparin  Code Status: DNR  Maretta Bees,  MD. 05/06/2012, 10:22 AM

## 2012-05-06 NOTE — Progress Notes (Addendum)
Clinical Social Work Department CLINICAL SOCIAL WORK PLACEMENT NOTE 05/06/2012  Patient:  Sharon Fletcher, Sharon Fletcher  Account Number:  [id number] Admit date:  05/05/2012  Clinical Social Worker:  Jacelyn Grip  Date/time:  05/06/2012 11:45 AM  Clinical Social Work is seeking post-discharge placement for this patient at the following level of care:   SKILLED NURSING   (*CSW will update this form in Epic as items are completed)   05/06/2012  Patient/family provided with [facility] Department of Clinical Social Work's list of facilities offering this level of care within the geographic area requested by the patient (or if unable, by the patient's family).  05/06/2012  Patient/family informed of their freedom to choose among providers that offer the needed level of care, that participate in Medicare, Medicaid or managed care program needed by the patient, have an available bed and are willing to accept the patient.  05/06/2012  Patient/family informed of [facility]' ownership interest in [facility], as well as of the fact that they are under no obligation to receive care at this facility.  PASARR submitted to EDS on 05/06/2012 PASARR number received from EDS on   FL2 transmitted to all facilities in geographic area requested by pt/family on  05/06/2012 FL2 transmitted to all facilities within larger geographic area on   Patient informed that his/her managed care company has contracts with or will negotiate with  certain facilities, including the following:     Patient/family informed of bed offers received: Patient from [facility] and returning there.  Patient chooses bed at  Physician recommends and patient chooses bed at    Patient to be transferred to [facility] on 05/08/12   Patient to be transferred to facility by ambulance  The following physician request were entered in Epic:   Additional Comments:  05/07/2012-Bed offers not provided to pt son as pt  son has chosen for pt to return [facility] at discharge. Facility has confirmed that they can accept pt back and anticipate discharge for Saturday 5/25. 05/08/12 - Discharge summary faxed to facility and RN called nursing director to give report. Patient's son [name] contacted and advised that ambulance called to transport patient back to ALF.  Jacklynn Lewis, MSW, LCSWA  Clinical Social Work [phone]

## 2012-05-06 NOTE — Care Management Note (Signed)
    Page 1 of 1   05/07/2012     2:45:25 PM   CARE MANAGEMENT NOTE 05/07/2012  Patient:  Sharon Fletcher, Sharon Fletcher   Account Number:  [id number]  Date Initiated:  05/06/2012  Documentation initiated by:  Letha Cape  Subjective/Objective Assessment:   dx hypotension  admit- from [facility]     Action/Plan:   pt eval- recs snf   Anticipated DC Date:  05/08/2012   Anticipated DC Plan:  ASSISTED LIVING / REST HOME  In-house referral  Clinical Social Worker      DC Planning Services  CM consult      Choice offered to / List presented to:             Status of service:  Completed, signed off Medicare Important Message given?   (If response is "NO", the following Medicare IM given date fields will be blank) Date Medicare IM given:   Date Additional Medicare IM given:    Discharge Disposition:  ASSISTED LIVING  Per UR Regulation:  Reviewed for med. necessity/level of care/duration of stay  If discussed at Long Length of Stay Meetings, dates discussed:    Comments:  05/07/12 14:43 Letha Cape RN, BSN [phone] patient is for dc to ALF tomorrow, hhpt is on the fl2 and [facility] has their own hh agency they use.  Patient is all set to be dc for tomorrow.  05/06/12 17:25 Letha Cape RN, BSN [phone] patient is from [facility], per physical therapy rec snf.  CSW referral.

## 2012-05-07 DIAGNOSIS — F0390 Unspecified dementia without behavioral disturbance: Secondary | ICD-10-CM

## 2012-05-07 DIAGNOSIS — I959 Hypotension, unspecified: Secondary | ICD-10-CM

## 2012-05-07 DIAGNOSIS — R404 Transient alteration of awareness: Secondary | ICD-10-CM

## 2012-05-07 DIAGNOSIS — D696 Thrombocytopenia, unspecified: Secondary | ICD-10-CM

## 2012-05-07 DIAGNOSIS — R4182 Altered mental status, unspecified: Secondary | ICD-10-CM

## 2012-05-07 DIAGNOSIS — R112 Nausea with vomiting, unspecified: Secondary | ICD-10-CM

## 2012-05-07 DIAGNOSIS — E032 Hypothyroidism due to medicaments and other exogenous substances: Secondary | ICD-10-CM

## 2012-05-07 LAB — BASIC METABOLIC PANEL
BUN: 8 mg/dL (ref 6–23)
CO2: 21 mEq/L (ref 19–32)
Calcium: 8.4 mg/dL (ref 8.4–10.5)
Creatinine, Ser: 0.44 mg/dL — ABNORMAL LOW (ref 0.50–1.10)
GFR calc non Af Amer: >90 mL/min (ref >90)
Glucose, Bld: 90 mg/dL (ref 70–99)
Sodium: 138 mEq/L (ref 135–145)

## 2012-05-07 LAB — CBC
Hemoglobin: 9.6 g/dL — ABNORMAL LOW (ref 12.0–15.0)
MCH: 24.7 pg — ABNORMAL LOW (ref 26.0–34.0)
MCHC: 30.8 g/dL (ref 30.0–36.0)
MCV: 80.4 fL (ref 78.0–100.0)
RBC: 3.88 MIL/uL (ref 3.87–5.11)

## 2012-05-07 LAB — CLOSTRIDIUM DIFFICILE BY PCR: Toxigenic C. Difficile by PCR: NEGATIVE

## 2012-05-07 MED ORDER — AMOXICILLIN-POT CLAVULANATE 875-125 MG PO TABS: 1.0000 | ORAL_TABLET | Freq: Two times a day (BID) | ORAL | Status: AC

## 2012-05-07 MED ORDER — ACETAMINOPHEN 500 MG PO TABS: 1000.0000 mg | ORAL_TABLET | Freq: Three times a day (TID) | ORAL | Status: AC

## 2012-05-07 NOTE — Progress Notes (Signed)
PATIENT DETAILS Name: Sharon Fletcher Age: 78 y.o. Sex: female Date of Birth: 1929-06-14 Admit Date: 05/05/2012 [id number] COLLINS, MD, MD  Subjective: Remains pleasantly confused  Objective: Vital signs in last 24 hours: Filed Vitals:   05/06/12 1100 05/06/12 1405 05/06/12 2231 05/07/12 0421  BP:  104/69 110/65 144/73  Pulse:  84 84 81  Temp:  99.9 F (37.7 C) 97.8 F (36.6 C) 99.3 F (37.4 C)  TempSrc:  Oral Oral Oral  Resp:  20 18 16   Height:      Weight:      SpO2: 94% 94% 95% 93%    Weight change:   Body mass index is 28.61 kg/(m^2).  Intake/Output from previous day:  Intake/Output Summary (Last 24 hours) at 05/07/12 1256 Last data filed at 05/07/12 0551  Gross per 24 hour  Intake 1216.67 ml  Output    751 ml  Net 465.67 ml    PHYSICAL EXAM: Gen Exam: Pleasantly confused, with clear speech.  Neck: Supple, No JVD.   Chest: B/L Clear.   CVS: S1 S2 Regular, no murmurs.  Abdomen: soft, BS +, non tender, non distended.  Extremities: no edema, lower extremities warm to touch. Neurologic: Non Focal.   Skin: No Rash.   Wounds: N/A.    CONSULTS:  None  LAB RESULTS: CBC  Lab 05/07/12 0530 05/06/12 0856 05/05/12 1048  WBC 8.7 7.5 7.4  HGB 9.6* 9.4* 10.4*  HCT 31.2* 30.2* 33.4*  PLT 215 215 PLATELET CLUMPS NOTED ON SMEAR, COUNT APPEARS ADEQUATE  MCV 80.4 80.3 80.7  MCH 24.7* 25.0* 25.1*  MCHC 30.8 31.1 31.1  RDW 18.5* 18.7* 18.5*  LYMPHSABS -- -- 2.6  MONOABS -- -- 0.7  EOSABS -- -- 0.4  BASOSABS -- -- 0.1  BANDABS -- -- --    Chemistries   Lab 05/07/12 0530 05/06/12 0856 05/05/12 1048  NA 138 137 136  K 3.5 3.9 3.9  CL 107 107 104  CO2 21 20 18*  GLUCOSE 90 119* 81  BUN 8 10 13   CREATININE 0.44* 0.42* 0.38*  CALCIUM 8.4 8.2* 8.9  MG -- -- --    GFR Estimated Creatinine Clearance: 54.9 ml/min (by C-G formula based on Cr of 0.44).  Coagulation profile No results found for this basename: INR:5,PROTIME:5 in the last 168  hours  Cardiac Enzymes  Lab 05/06/12 0856 05/05/12 2307 05/05/12 1603  CKMB 2.0 2.7 2.9  TROPONINI <0.30 <0.30 <0.30  MYOGLOBIN -- -- --    No components found with this basename: POCBNP:3  Basename 05/05/12 1603  DDIMER 3.62*   No results found for this basename: HGBA1C:2 in the last 72 hours No results found for this basename: CHOL:2,HDL:2,LDLCALC:2,TRIG:2,CHOLHDL:2,LDLDIRECT:2 in the last 72 hours  Basename 05/05/12 1603  TSH 1.900  T4TOTAL --  T3FREE --  THYROIDAB --    Basename 05/05/12 1603  VITAMINB12 1075*  FOLATE --  FERRITIN --  TIBC --  IRON --  RETICCTPCT --   No results found for this basename: LIPASE:2,AMYLASE:2 in the last 72 hours  Urine Studies No results found for this basename: UACOL:2,UAPR:2,USPG:2,UPH:2,UTP:2,UGL:2,UKET:2,UBIL:2,UHGB:2,UNIT:2,UROB:2,ULEU:2,UEPI:2,UWBC:2,URBC:2,UBAC:2,CAST:2,CRYS:2,UCOM:2,BILUA:2 in the last 72 hours  MICROBIOLOGY: Recent Results (from the past 240 hour(s))  CULTURE, BLOOD (ROUTINE X 2)     Status: Normal (Preliminary result)   Collection Time   05/05/12  4:15 PM      Component Value Range Status Comment   Specimen Description BLOOD ARM RIGHT   Final    Special Requests BOTTLES DRAWN AEROBIC ONLY 10CC  Final    Culture  Setup Time [id number]   Final    Culture     Final    Value:        BLOOD CULTURE RECEIVED NO GROWTH TO DATE CULTURE WILL BE HELD FOR 5 DAYS BEFORE ISSUING A FINAL NEGATIVE REPORT   Report Status PENDING   Incomplete   CULTURE, BLOOD (ROUTINE X 2)     Status: Normal (Preliminary result)   Collection Time   05/05/12  4:20 PM      Component Value Range Status Comment   Specimen Description BLOOD ARM RIGHT   Final    Special Requests BOTTLES DRAWN AEROBIC ONLY 10CC   Final    Culture  Setup Time [id number]   Final    Culture     Final    Value:        BLOOD CULTURE RECEIVED NO GROWTH TO DATE CULTURE WILL BE HELD FOR 5 DAYS BEFORE ISSUING A FINAL NEGATIVE REPORT   Report Status PENDING    Incomplete   MRSA PCR SCREENING     Status: Normal   Collection Time   05/05/12  8:32 PM      Component Value Range Status Comment   MRSA by PCR NEGATIVE  NEGATIVE  Final   CLOSTRIDIUM DIFFICILE BY PCR     Status: Normal   Collection Time   05/06/12  6:46 PM      Component Value Range Status Comment   C difficile by pcr NEGATIVE  NEGATIVE  Final     RADIOLOGY STUDIES/RESULTS: Dg Chest 2 View  05/05/2012  *RADIOLOGY REPORT*  Clinical Data: Hypotension.  Acute mental status changes.  CHEST - 2 VIEW  Comparison: None.  Findings: Suboptimal inspiration accounts for atelectasis in the lower lobes and lingula.  Cardiac silhouette mildly enlarged. Pulmonary venous hypertension without overt edema.  Lungs otherwise clear.  No pleural effusions.  Thoracic aorta atherosclerotic. Hilar and mediastinal contours otherwise unremarkable. Degenerative changes involving the thoracic spine.  Degenerative changes in both shoulders with calcified loose bodies in the left shoulder joint.  IMPRESSION: Suboptimal inspiration accounts for atelectasis in the lower lobes and lingula.  No acute cardiopulmonary disease otherwise.  Mild cardiomegaly without pulmonary edema.  Original Report Authenticated By: Arnell Sieving, M.D.   Ct Angio Chest W/cm &/or Wo Cm  05/05/2012  *RADIOLOGY REPORT*  Clinical Data: Hypotension  CT ANGIOGRAPHY CHEST  Technique:  Multidetector CT imaging of the chest using the standard protocol during bolus administration of intravenous contrast. Multiplanar reconstructed images including MIPs were obtained and reviewed to evaluate the vascular anatomy.  Contrast:  100 ml Omnipaque 350  Comparison: None.  Findings: Respiratory motion artifact limits the study.  No obvious filling defects in the pulmonary arterial tree to suggest acute pulmonary thromboembolism.  Negative abnormal mediastinal adenopathy.  Sub centimeter short axis diameter precarinal node.  No pericardial effusion  No pneumothorax.   Tiny pleural effusions are present.  Patchy airspace disease in the posterior basal left lower lobe. There is a branching opacity in the right middle lobe worrisome for mucoid impaction within a dilated peripheral bronchi.  This may also represent an unusual appearance of airspace disease or pulmonary mass.  He is to 0.6 x 0.9 cm on image 53.  Severe degenerative change of both glenohumeral joints. 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  IMPRESSION: No evidence of acute pulmonary thromboembolism.  Branching opacity in the right middle lobe as described which may simply represent mucoid impaction within  a dilated peripheral airway.  Pulmonary mass or airspace disease to have a similar appearance.  At the minimum, 30-month follow-up is recommended to ensure resolution.  Alternatively, PET CT can be performed.  Small pleural effusions  Patchy left lower lobe airspace disease.  Original Report Authenticated By: Donavan Burnet, M.D.    MEDICATIONS: Scheduled Meds:    . acetaminophen  1,000 mg Oral TID  . ampicillin-sulbactam (UNASYN) IV  3 g Intravenous Q8H  . bimatoprost  1 drop Both Eyes QHS  . brimonidine  1 drop Both Eyes Q12H  . fluticasone  1 puff Inhalation BID  . heparin subcutaneous  5,000 Units Subcutaneous Q8H  . nystatin   Topical q morning - 10a  . olopatadine  1 drop Both Eyes BID  . omega-3 acid ethyl esters  1 g Oral BID  . polyethylene glycol  17 g Oral Daily  . polyvinyl alcohol  1 drop Both Eyes QID  . risperiDONE  1 mg Oral Daily  . sodium chloride  3 mL Intravenous Q12H  . sodium chloride  3 mL Intravenous Q12H  . timolol  1 drop Both Eyes Q12H  . vancomycin  1,250 mg Intravenous Once  . vitamin B-12  1,000 mcg Oral Daily  . DISCONTD: vancomycin  750 mg Intravenous Q12H   Continuous Infusions:    . DISCONTD: sodium chloride 50 mL/hr at 05/07/12 1158   PRN Meds:.sodium chloride, acetaminophen, acetaminophen, morphine injection, ondansetron (ZOFRAN)  IV, ondansetron, oxyCODONE, sodium chloride  Antibiotics: Anti-infectives     Start     Dose/Rate Route Frequency Ordered Stop   05/07/12 0000   vancomycin (VANCOCIN) 750 mg in sodium chloride 0.9 % 150 mL IVPB  Status:  Discontinued        750 mg 150 mL/hr over 60 Minutes Intravenous Every 12 hours 05/06/12 1043 05/07/12 1244   05/07/12 0000   amoxicillin-clavulanate (AUGMENTIN) 875-125 MG per tablet        1 tablet Oral 2 times daily 05/07/12 1246 05/17/12 2359   05/06/12 1300   Ampicillin-Sulbactam (UNASYN) 3 g in sodium chloride 0.9 % 100 mL IVPB        3 g 100 mL/hr over 60 Minutes Intravenous Every 8 hours 05/06/12 1043     05/06/12 1200   vancomycin (VANCOCIN) 1,250 mg in sodium chloride 0.9 % 250 mL IVPB        1,250 mg 166.7 mL/hr over 90 Minutes Intravenous  Once 05/06/12 1043 05/06/12 1318          Assessment/Plan: Patient Active Hospital Problem List: Hypotension -not sure whether this was all just dehydration, or with some component from PNA -BP now better with IVF resuscitiation -since resolved-stop all IVF  Altered mental status -worse than baseline on 5/22-now calmer-closer to baseline -likely toxic metabolic encephalopathy from PNA -will continue with antibiotics  PNA -Health care associated-given she lives in a ALF -CT scan of Chest-shows airspace disease vs lung mass with impacted mucus-discussed with son at bedside-current plan is to treat as PNA-and if family desires to repeat CT Chest in 6-8 weeks, however after discussion with son-he desires no further work-up if this turns out to be malignant. -Continue with Unasyn, stop Vanco  Anemia -apparently had a PRBC transfusion a few weeks back -family has refused EGD/Colonoscopy in past-however no overt signs on bleeding -continue with Vitamin B12 supplementation  Arthritis -has arthralgias-in B/L hands/elbow's/shoulders/knees -start scheduled  Tylenol, given anemia-would avoid-NSAIDS  Dementia with  Delirium -worsened from encephalopathy from  likely HCAP -Continue with risperidone  Atrial fibrillation -not a coumadin candidate -currently sinus in telemetry  Disposition: -remain inpatient-likely will SNF on discharge. However after long discussion with Carter-pt's sone-he wants her now to go back to ALF with HHPT. Social worker/case management will make necessary arrangements. Awaiting Palliative care meeting-but potential plans to discharge back to ALF 5/25.  DVT Prophylaxis: Prophylactic Heparin  Code Status: DNR  Maretta Bees,  MD. 05/07/2012, 12:56 PM

## 2012-05-07 NOTE — Consult Note (Signed)
Consult Note from the Palliative Medicine Team at [facility] Patient YN:WGNFA Sharon Fletcher      DOB: 05-10-1929      OZH:[id number]   Consult Requested by: Dr Jerral Ralph     PCP: Astrid Divine, MD, MD Reason for Consultation: Goals of care     Phone Number:[phone]  Assessment and Plan: Dementia Pneumonia Altered mental status  Discussed in detail with son, who wants her to go to [facility], She is DNR, MOST form was filled up last year by Dr Ladona Ridgel, and son does not want to fill up another form at this time. Palliative care can follow up at ALF..  1. Code Status: DNR 2. Symptom Control: Continue Risperdal for agitation 3. Psycho/Social: Son is supportive 4. Disposition: ALF with palliative care follow up  Patient Documents Completed or Given: Document Given Completed  Advanced Directives Pkt    MOST    DNR    Gone from My Sight    Hard Choices      Brief HPI: This is an 78 year old female, resident of [facility]F, with known history of profound dementia, peripheral neuropathy, DJD, cervical stenosis/lumbar stenosis, vitamin B12 deficiency, OSA, COPD, atrial fibrillation, not considered coumadin candidate, due to dementia and anemia, GERD, diverticulosis, glaucoma, dyslipidemia, hypothyroidism, chronic iron deficiency anemia. EGD/Colonoscopy offered by [facility] in 04/2011, but declined by patient's son/HPOA, Sharon Fletcher, who did not want aggressive/invasive work up. Patient now came to hospital with hypotension, found to have pneumonia. CT also showed possible lung mass, will need follow up CT chest, son does not want aggressive intervention if found to have lung cancer. Discussed with patient's son regarding code status, artificial nutrition, disposition.  ROS: Pain- Denies Nausea- denies  Vomiting- denies Insomnia- denies Appetite- good    PMH:  Past Medical History  Diagnosis Date  . Dementia   . Anemia   . Hypercholesteremia   .  Peripheral neuropathy   . GERD (gastroesophageal reflux disease)   . Restless leg syndrome   . Diverticular disease   . Incontinence      GMW:NUUVOZD reviewed. No pertinent past surgical history. I have reviewed the FH and SH and  If appropriate update it with new information. No Known Allergies Scheduled Meds:   . acetaminophen  1,000 mg Oral TID  . ampicillin-sulbactam (UNASYN) IV  3 g Intravenous Q8H  . bimatoprost  1 drop Both Eyes QHS  . brimonidine  1 drop Both Eyes Q12H  . fluticasone  1 puff Inhalation BID  . heparin subcutaneous  5,000 Units Subcutaneous Q8H  . nystatin   Topical q morning - 10a  . olopatadine  1 drop Both Eyes BID  . omega-3 acid ethyl esters  1 g Oral BID  . polyethylene glycol  17 g Oral Daily  . polyvinyl alcohol  1 drop Both Eyes QID  . risperiDONE  1 mg Oral Daily  . sodium chloride  3 mL Intravenous Q12H  . sodium chloride  3 mL Intravenous Q12H  . timolol  1 drop Both Eyes Q12H  . vitamin B-12  1,000 mcg Oral Daily  . DISCONTD: vancomycin  750 mg Intravenous Q12H   Continuous Infusions:   . DISCONTD: sodium chloride 50 mL/hr at 05/07/12 1158   PRN Meds:.sodium chloride, acetaminophen, acetaminophen, morphine injection, ondansetron (ZOFRAN) IV, ondansetron, oxyCODONE, sodium chloride    BP 140/68  Pulse 80  Temp(Src) 98.6 F (37 C) (Oral)  Resp 18  Ht 5\' 4"  (1.626 m)  Wt  75.615 kg (166 lb 11.2 oz)  BMI 28.61 kg/m2  SpO2 94%      Intake/Output Summary (Last 24 hours) at 05/07/12 1819 Last data filed at 05/07/12 1500  Gross per 24 hour  Intake 1576.67 ml  Output    501 ml  Net 1075.67 ml     Physical Exam:  General: patient communicating, pleasantly confused HEENT: Oral mucosa pink and moist Chest:  Clear b/l CVS: S1S2 RRR Abdomen: Soft, nontender Ext: No edema Neuro: Alert, pleasantly confused  Labs: CBC    Component Value Date/Time   WBC 8.7 05/07/2012 0530   RBC 3.88 05/07/2012 0530   HGB 9.6* 05/07/2012 0530     HCT 31.2* 05/07/2012 0530   PLT 215 05/07/2012 0530   MCV 80.4 05/07/2012 0530   MCH 24.7* 05/07/2012 0530   MCHC 30.8 05/07/2012 0530   RDW 18.5* 05/07/2012 0530   LYMPHSABS 2.6 05/05/2012 1048   MONOABS 0.7 05/05/2012 1048   EOSABS 0.4 05/05/2012 1048   BASOSABS 0.1 05/05/2012 1048    BMET    Component Value Date/Time   NA 138 05/07/2012 0530   K 3.5 05/07/2012 0530   CL 107 05/07/2012 0530   CO2 21 05/07/2012 0530   GLUCOSE 90 05/07/2012 0530   BUN 8 05/07/2012 0530   CREATININE 0.44* 05/07/2012 0530   CALCIUM 8.4 05/07/2012 0530   GFRNONAA >90 05/07/2012 0530   GFRAA >90 05/07/2012 0530    CMP     Component Value Date/Time   NA 138 05/07/2012 0530   K 3.5 05/07/2012 0530   CL 107 05/07/2012 0530   CO2 21 05/07/2012 0530   GLUCOSE 90 05/07/2012 0530   BUN 8 05/07/2012 0530   CREATININE 0.44* 05/07/2012 0530   CALCIUM 8.4 05/07/2012 0530   PROT 5.8* 05/06/2012 0856   ALBUMIN 2.4* 05/06/2012 0856   AST 9 05/06/2012 0856   ALT 6 05/06/2012 0856   ALKPHOS 70 05/06/2012 0856   BILITOT 0.4 05/06/2012 0856   GFRNONAA >90 05/07/2012 0530   GFRAA >90 05/07/2012 0530        Time In Time Out Total Time Spent with Patient Total Overall Time  5: 30 pm 6:30 pm 45 min 60 min    Greater than 50%  of this time was spent counseling and coordinating care related to the above assessment and plan.

## 2012-05-07 NOTE — Discharge Summary (Signed)
PATIENT DETAILS Name: Sharon Fletcher Age: 78 y.o. Sex: female Date of Birth: [birth year] MRN: [id number]. Admit Date: 05/05/2012 Admitting Physician: Laveda Norman, MD [id number] Thomasena Edis, MD, MD  PRIMARY DISCHARGE DIAGNOSIS:  Principal Problem:  *Hypotension Active Problems:  Altered mental status  HCAP  Anemia  Hypothyroidism  Dementia  Atrial fibrillation      PAST MEDICAL HISTORY: Past Medical History  Diagnosis Date  . Dementia   . Anemia   . Hypercholesteremia   . Peripheral neuropathy   . GERD (gastroesophageal reflux disease)   . Restless leg syndrome   . Diverticular disease   . Incontinence     DISCHARGE MEDICATIONS: Medication List  As of 05/07/2012 12:55 PM   STOP taking these medications         ciprofloxacin 500 MG tablet         TAKE these medications         acetaminophen 500 MG tablet   Commonly known as: TYLENOL   Take 2 tablets (1,000 mg total) by mouth 3 (three) times daily.      amoxicillin-clavulanate 875-125 MG per tablet   Commonly known as: AUGMENTIN   Take 1 tablet by mouth 2 (two) times daily. For 6 more days from 05/07/12      beclomethasone 80 MCG/ACT inhaler   Commonly known as: QVAR   Inhale 2 puffs into the lungs 2 (two) times daily.      bimatoprost 0.01 % Soln   Commonly known as: LUMIGAN   Place 1 drop into both eyes at bedtime.      COMBIGAN 0.2-0.5 % ophthalmic solution   Generic drug: brimonidine-timolol   Place 1 drop into both eyes every 12 (twelve) hours.      fish oil-omega-3 fatty acids 1000 MG capsule   Take 1 g by mouth 2 (two) times daily.      NATURAL BALANCE TEARS 0.4 % Soln   Generic drug: Hypromellose   Place 1 drop into both eyes 4 (four) times daily.      nystatin 100000 UNIT/GM Powd   Apply topically.      olopatadine 0.1 % ophthalmic solution   Commonly known as: PATANOL   Place 1 drop into both eyes 2 (two) times daily.      polyethylene glycol packet   Commonly known as: MIRALAX  / GLYCOLAX   Take 17 g by mouth daily.      risperiDONE 1 MG tablet   Commonly known as: RISPERDAL   Take 1 mg by mouth daily.      vitamin B-12 1000 MCG tablet   Commonly known as: CYANOCOBALAMIN   Take 1,000 mcg by mouth daily.             BRIEF HPI:  See H&P, Labs, Consult and Test reports for all details in brief,78 year old female, resident of [facility], with known history of profound dementia, peripheral neuropathy, DJD, cervical stenosis/lumbar stenosis, vitamin B12 deficiency, OSA, COPD, atrial fibrillation, not considered coumadin candidate, due to dementia and anemia, GERD, diverticulosis, glaucoma, dyslipidemia, hypothyroidism, chronic iron deficiency anemia. EGD/Colonoscopy offered by [facility] in 04/2011, but declined by patient's son/HPOA, Sharon Fletcher, who did not want aggressive/invasive work up. She is unable to contribute to history, in view of severe dementia and altered mental status, so history was gleaned from ED MD. It appears that for the past few days, patient has become increasingly confused beyond baseline, worse in the last couple of days. She was brought  to ED on suspicion of a possible UTI, but was found to be significantly hypotensive, with SBP in the 70s. She was bolused with iv NS, and SBP is now 103. She had reportedly, been treated with a 10-day course of Ciprofloxacin, from 04/13/12.   CONSULTATIONS:  Palliative Care  PERTINENT RADIOLOGIC STUDIES: Dg Chest 2 View  05/05/2012  *RADIOLOGY REPORT*  Clinical Data: Hypotension.  Acute mental status changes.  CHEST - 2 VIEW  Comparison: None.  Findings: Suboptimal inspiration accounts for atelectasis in the lower lobes and lingula.  Cardiac silhouette mildly enlarged. Pulmonary venous hypertension without overt edema.  Lungs otherwise clear.  No pleural effusions.  Thoracic aorta atherosclerotic. Hilar and mediastinal contours otherwise unremarkable. Degenerative changes involving the thoracic spine.   Degenerative changes in both shoulders with calcified loose bodies in the left shoulder joint.  IMPRESSION: Suboptimal inspiration accounts for atelectasis in the lower lobes and lingula.  No acute cardiopulmonary disease otherwise.  Mild cardiomegaly without pulmonary edema.  Original Report Authenticated By: Arnell Sieving, M.D.   Ct Angio Chest W/cm &/or Wo Cm  05/05/2012  *RADIOLOGY REPORT*  Clinical Data: Hypotension  CT ANGIOGRAPHY CHEST  Technique:  Multidetector CT imaging of the chest using the standard protocol during bolus administration of intravenous contrast. Multiplanar reconstructed images including MIPs were obtained and reviewed to evaluate the vascular anatomy.  Contrast:  100 ml Omnipaque 350  Comparison: None.  Findings: Respiratory motion artifact limits the study.  No obvious filling defects in the pulmonary arterial tree to suggest acute pulmonary thromboembolism.  Negative abnormal mediastinal adenopathy.  Sub centimeter short axis diameter precarinal node.  No pericardial effusion  No pneumothorax.  Tiny pleural effusions are present.  Patchy airspace disease in the posterior basal left lower lobe. There is a branching opacity in the right middle lobe worrisome for mucoid impaction within a dilated peripheral bronchi.  This may also represent an unusual appearance of airspace disease or pulmonary mass.  He is to 0.6 x 0.9 cm on image 53.  Severe degenerative change of both glenohumeral joints. 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  IMPRESSION: No evidence of acute pulmonary thromboembolism.  Branching opacity in the right middle lobe as described which may simply represent mucoid impaction within a dilated peripheral airway.  Pulmonary mass or airspace disease to have a similar appearance.  At the minimum, 22-month follow-up is recommended to ensure resolution.  Alternatively, PET CT can be performed.  Small pleural effusions  Patchy left lower lobe airspace  disease.  Original Report Authenticated By: Donavan Burnet, M.D.     PERTINENT LAB RESULTS: CBC:  Basename 05/07/12 0530 05/06/12 0856  WBC 8.7 7.5  HGB 9.6* 9.4*  HCT 31.2* 30.2*  PLT 215 215   CMET CMP     Component Value Date/Time   NA 138 05/07/2012 0530   K 3.5 05/07/2012 0530   CL 107 05/07/2012 0530   CO2 21 05/07/2012 0530   GLUCOSE 90 05/07/2012 0530   BUN 8 05/07/2012 0530   CREATININE 0.44* 05/07/2012 0530   CALCIUM 8.4 05/07/2012 0530   PROT 5.8* 05/06/2012 0856   ALBUMIN 2.4* 05/06/2012 0856   AST 9 05/06/2012 0856   ALT 6 05/06/2012 0856   ALKPHOS 70 05/06/2012 0856   BILITOT 0.4 05/06/2012 0856   GFRNONAA >90 05/07/2012 0530   GFRAA >90 05/07/2012 0530    GFR Estimated Creatinine Clearance: 54.9 ml/min (by C-G formula based on Cr of 0.44). No results found for this basename:  LIPASE:2,AMYLASE:2 in the last 72 hours  Basename 05/06/12 0856 05/05/12 2307 05/05/12 1603  CKTOTAL 45 54 58  CKMB 2.0 2.7 2.9  CKMBINDEX -- -- --  TROPONINI <0.30 <0.30 <0.30   No components found with this basename: POCBNP:3  Basename 05/05/12 1603  DDIMER 3.62*   No results found for this basename: HGBA1C:2 in the last 72 hours No results found for this basename: CHOL:2,HDL:2,LDLCALC:2,TRIG:2,CHOLHDL:2,LDLDIRECT:2 in the last 72 hours  Basename 05/05/12 1603  TSH 1.900  T4TOTAL --  T3FREE --  THYROIDAB --    Basename 05/05/12 1603  VITAMINB12 1075*  FOLATE --  FERRITIN --  TIBC --  IRON --  RETICCTPCT --   Coags: No results found for this basename: PT:2,INR:2 in the last 72 hours Microbiology: Recent Results (from the past 240 hour(s))  CULTURE, BLOOD (ROUTINE X 2)     Status: Normal (Preliminary result)   Collection Time   05/05/12  4:15 PM      Component Value Range Status Comment   Specimen Description BLOOD ARM RIGHT   Final    Special Requests BOTTLES DRAWN AEROBIC ONLY 10CC   Final    Culture  Setup Time [id number]   Final    Culture     Final    Value:         BLOOD CULTURE RECEIVED NO GROWTH TO DATE CULTURE WILL BE HELD FOR 5 DAYS BEFORE ISSUING A FINAL NEGATIVE REPORT   Report Status PENDING   Incomplete   CULTURE, BLOOD (ROUTINE X 2)     Status: Normal (Preliminary result)   Collection Time   05/05/12  4:20 PM      Component Value Range Status Comment   Specimen Description BLOOD ARM RIGHT   Final    Special Requests BOTTLES DRAWN AEROBIC ONLY 10CC   Final    Culture  Setup Time [id number]   Final    Culture     Final    Value:        BLOOD CULTURE RECEIVED NO GROWTH TO DATE CULTURE WILL BE HELD FOR 5 DAYS BEFORE ISSUING A FINAL NEGATIVE REPORT   Report Status PENDING   Incomplete   MRSA PCR SCREENING     Status: Normal   Collection Time   05/05/12  8:32 PM      Component Value Range Status Comment   MRSA by PCR NEGATIVE  NEGATIVE  Final   CLOSTRIDIUM DIFFICILE BY PCR     Status: Normal   Collection Time   05/06/12  6:46 PM      Component Value Range Status Comment   C difficile by pcr NEGATIVE  NEGATIVE  Final      BRIEF HOSPITAL COURSE:   Hypotension  -was present on admission-resolved with IV Fluids -not sure whether this was all just dehydration, or with some component from PNA    Altered mental status  -worse than baseline  -likely toxic metabolic encephalopathy from PNA -better with antibiotic treatment of PNA  PNA  -Health care associated-given she lives in a ALF  -CT scan of Chest-shows airspace disease vs lung mass with impacted mucus-discussed with son at bedside-current plan is to treat as PNA-and if family desires to repeat CT Chest in 6-8 weeks, however after discussion with son-he desires no further work-up if this turns out to be malignant. - In  Hospital she was treated with  Vanco/Unasyn, plans are to transition to [facility] discharge -currently is afebrile, with stable hemodynamics. Not toxic looking as  well.  Anemia  -apparently had a PRBC transfusion a few weeks back  -family has refused EGD/Colonoscopy  in past-however no overt signs on bleeding  -continue with Vitamin B12 supplementation  Arthritis  -has generalized arthralgias-in B/L hands/elbow's/shoulders/knees  -start scheduled Tylenol, given anemia-would avoid-NSAIDS -none of the joints have any overlying erythema or swelling to suggest gouty flare or infection  Dementia with Delirium  -worsened from encephalopathy from likely HCAP-but better this am -Continue with risperidone  Atrial fibrillation  -not a coumadin candidate -in sinus  Code Status -DNR -Focus on care is for comfort. Family/son-does not desire any aggressive measures. Will need palliative care follow up at ALF   TODAY-DAY OF DISCHARGE:  Subjective:   Blakeley Scheier is calm-but pleasantly confused, likely close to usual baseline  Objective:   Blood pressure 144/73, pulse 81, temperature 99.3 F (37.4 C), temperature source Oral, resp. rate 16, height 5\' 4"  (1.626 m), weight 75.615 kg (166 lb 11.2 oz), SpO2 93.00%.  Intake/Output Summary (Last 24 hours) at 05/07/12 1255 Last data filed at 05/07/12 0551  Gross per 24 hour  Intake 1216.67 ml  Output    751 ml  Net 465.67 ml    Exam Awake Alert, Oriented *3, No new F.N deficits, Normal affect [place].AT,PERRAL Supple Neck,No JVD, No cervical lymphadenopathy appriciated.  Symmetrical Chest wall movement, Good air movement bilaterally, CTAB RRR,No Gallops,Rubs or new Murmurs, No Parasternal Heave +ve B.Sounds, Abd Soft, Non tender, No organomegaly appriciated, No rebound -guarding or rigidity. No Cyanosis, Clubbing or edema, No new Rash or bruise  DISPOSITION: Initially plans were to transfer to SNF on discharge, however son now wants to go back to ALF with PT services   DISCHARGE INSTRUCTIONS:    Follow-up Information    Follow up with Astrid Divine, MD. Schedule an appointment as soon as possible for a visit in 1 week.   Contact information:   [address], [address] [place] [address] [phone]           Total Time spent on discharge equals 45 minutes.  SignedJeoffrey Massed 05/07/2012 12:55 PM

## 2012-05-07 NOTE — Progress Notes (Signed)
Clinical Social Worker received phone call from pt son this morning regarding pt disposition planning. Pt son discussed that after further thought and discussion with [facility] that he would like pt to return to [facility] at discharge. Clinical Social Worker contacted [facility] and faxed pt clinical information and [facility] is able to accept pt back to facility at discharge and will begin [facility] PT with pt at ALF and facility has their own therapy department. Clinical Social Worker discussed with MD and MD anticipates pt will be medically ready for discharge on Saturday 5/25. Clinical Social Worker spoke with [facility] and facility can accept pt over weekend. Clinical Social Worker faxed preliminary discharge summary, discharge medications, and FL-2 with medications added to facility. Clinical Social Worker contacted pt son to update and pt son agreeable to plan. Pt son is scheduled to meet with Palliative Medicine Team today at 5:30pm. Weekend Clinical Social Worker to facilitate pt discharge needs including updating discharge information if necessary and arranging non-emergency ambulance transport for pt at discharge. Clinical Social Worker to continue to follow.  Jacklynn Lewis, MSW, LCSWA  Clinical Social Work [phone]

## 2012-05-08 DIAGNOSIS — R112 Nausea with vomiting, unspecified: Secondary | ICD-10-CM

## 2012-05-08 DIAGNOSIS — E032 Hypothyroidism due to medicaments and other exogenous substances: Secondary | ICD-10-CM

## 2012-05-08 DIAGNOSIS — I959 Hypotension, unspecified: Secondary | ICD-10-CM

## 2012-05-08 DIAGNOSIS — R404 Transient alteration of awareness: Secondary | ICD-10-CM

## 2012-05-08 MED ORDER — FUROSEMIDE 10 MG/ML IJ SOLN
20.0000 mg | Freq: Once | INTRAMUSCULAR | Status: DC
  Filled 2012-05-08: qty 2

## 2012-05-08 MED ORDER — ACETAMINOPHEN 325 MG PO TABS: 650.0000 mg | ORAL_TABLET | Freq: Once | ORAL | Status: DC

## 2012-05-08 MED ORDER — DIPHENHYDRAMINE HCL 50 MG/ML IJ SOLN: 25.0000 mg | Freq: Once | INTRAMUSCULAR | Status: DC

## 2012-05-08 NOTE — Progress Notes (Signed)
Nsg Discharge Note  Admit Date:  05/05/2012 Discharge date: 05/08/2012   Garnet Koyanagi to be D/C'd Skilled nursing facility per MD order.  AVS completed.  Copy for chart.   Discharge Medication:  [place], Sch[place]zhauer  Home Medication Instructions ZOX:[id number]   Printed on:05/08/12 1652  Medication Information                    risperiDONE (RISPERDAL) 1 MG tablet Take 1 mg by mouth daily.           nystatin (MYCOSTATIN/NYSTOP) 100000 UNIT/GM POWD Apply topically.           bimatoprost (LUMIGAN) 0.01 % SOLN Place 1 drop into both eyes at bedtime.           polyethylene glycol (MIRALAX / GLYCOLAX) packet Take 17 g by mouth daily.           vitamin B-12 (CYANOCOBALAMIN) 1000 MCG tablet Take 1,000 mcg by mouth daily.           brimonidine-timolol (COMBIGAN) 0.2-0.5 % ophthalmic solution Place 1 drop into both eyes every 12 (twelve) hours.           fish oil-omega-3 fatty acids 1000 MG capsule Take 1 g by mouth 2 (two) times daily.           olopatadine (PATANOL) 0.1 % ophthalmic solution Place 1 drop into both eyes 2 (two) times daily.           beclomethasone (QVAR) 80 MCG/ACT inhaler Inhale 2 puffs into the lungs 2 (two) times daily.           Hypromellose (NATURAL BALANCE TEARS) 0.4 % SOLN Place 1 drop into both eyes 4 (four) times daily.           amoxicillin-clavulanate (AUGMENTIN) 875-125 MG per tablet Take 1 tablet by mouth 2 (two) times daily. For 6 more days from 05/07/12           acetaminophen (TYLENOL) 500 MG tablet Take 2 tablets (1,000 mg total) by mouth 3 (three) times daily.             Discharge Assessment: Filed Vitals:   05/08/12 0435  BP: 121/84  Pulse: 87  Temp: 97.9 F (36.6 C)  Resp: 20   Skin clean, dry and intact without evidence of skin break down, no evidence of skin tears noted. IV catheter discontinued intact. Site without signs and symptoms of complications - no redness or edema noted at insertion site, patient denies c/o pain  - only slight tenderness at site.  Dressing with slight pressure applied.  D/c Instructions-Education: Discharge instructions given to transporters for SNF.  Patient transported via [facility] to facility.Lyncoln Maskell Consuella Lose, RN 05/08/2012 4:52 PM

## 2012-05-12 LAB — CULTURE, BLOOD (ROUTINE X 2)
Culture  Setup Time: 201305230215
Culture  Setup Time: 201305230215
Culture: NO GROWTH
Culture: NO GROWTH

## 2012-05-23 ENCOUNTER — Inpatient Hospital Stay (HOSPITAL_COMMUNITY)
Admission: EM | Admit: 2012-05-23 | Discharge: 2012-05-24 | DRG: 315 | Disposition: A | Discharge status: Nursing Home (Includes ICF) | Payer: Medicare Other | Attending: Internal Medicine | Admitting: Internal Medicine

## 2012-05-23 ENCOUNTER — Encounter (HOSPITAL_COMMUNITY): Payer: Self-pay | Admitting: *Deleted

## 2012-05-23 ENCOUNTER — Emergency Department (HOSPITAL_COMMUNITY): Payer: Medicare Other

## 2012-05-23 DIAGNOSIS — R4182 Altered mental status, unspecified: Secondary | ICD-10-CM

## 2012-05-23 DIAGNOSIS — D649 Anemia, unspecified: Secondary | ICD-10-CM

## 2012-05-23 DIAGNOSIS — Z66 Do not resuscitate: Secondary | ICD-10-CM | POA: Diagnosis present

## 2012-05-23 DIAGNOSIS — I9589 Other hypotension: Principal | ICD-10-CM | POA: Diagnosis present

## 2012-05-23 DIAGNOSIS — R627 Adult failure to thrive: Secondary | ICD-10-CM | POA: Diagnosis present

## 2012-05-23 DIAGNOSIS — R112 Nausea with vomiting, unspecified: Secondary | ICD-10-CM

## 2012-05-23 DIAGNOSIS — I959 Hypotension, unspecified: Secondary | ICD-10-CM

## 2012-05-23 DIAGNOSIS — D62 Acute posthemorrhagic anemia: Secondary | ICD-10-CM | POA: Diagnosis present

## 2012-05-23 DIAGNOSIS — E871 Hypo-osmolality and hyponatremia: Secondary | ICD-10-CM | POA: Diagnosis present

## 2012-05-23 DIAGNOSIS — F0390 Unspecified dementia without behavioral disturbance: Secondary | ICD-10-CM | POA: Diagnosis present

## 2012-05-23 DIAGNOSIS — Y9210 Unspecified residential institution as the place of occurrence of the external cause: Secondary | ICD-10-CM | POA: Diagnosis present

## 2012-05-23 DIAGNOSIS — W07XXXA Fall from chair, initial encounter: Secondary | ICD-10-CM | POA: Diagnosis present

## 2012-05-23 DIAGNOSIS — K922 Gastrointestinal hemorrhage, unspecified: Secondary | ICD-10-CM | POA: Diagnosis present

## 2012-05-23 DIAGNOSIS — Z515 Encounter for palliative care: Secondary | ICD-10-CM

## 2012-05-23 DIAGNOSIS — R55 Syncope and collapse: Secondary | ICD-10-CM

## 2012-05-23 LAB — URINALYSIS, ROUTINE W REFLEX MICROSCOPIC
Bilirubin Urine: NEGATIVE
Glucose, UA: NEGATIVE mg/dL
Hgb urine dipstick: NEGATIVE
Ketones, ur: NEGATIVE mg/dL
Leukocytes, UA: NEGATIVE
Nitrite: NEGATIVE
Protein, ur: NEGATIVE mg/dL
Specific Gravity, Urine: 1.013 (ref 1.005–1.030)
Urobilinogen, UA: 1 mg/dL (ref 0.0–1.0)
pH: 7.5 (ref 5.0–8.0)

## 2012-05-23 LAB — DIFFERENTIAL
Basophils Absolute: 0 10*3/uL (ref 0.0–0.1)
Basophils Relative: 0 % (ref 0–1)
Eosinophils Absolute: 0.4 10*3/uL (ref 0.0–0.7)
Eosinophils Relative: 3 % (ref 0–5)
Lymphocytes Relative: 23 % (ref 12–46)
Lymphs Abs: 2.6 10*3/uL (ref 0.7–4.0)
Monocytes Absolute: 1.1 10*3/uL — ABNORMAL HIGH (ref 0.1–1.0)
Monocytes Relative: 10 % (ref 3–12)
Neutro Abs: 7.5 10*3/uL (ref 1.7–7.7)
Neutrophils Relative %: 65 % (ref 43–77)

## 2012-05-23 LAB — CBC
MCH: 25.3 pg — ABNORMAL LOW (ref 26.0–34.0)
MCV: 82.1 fL (ref 78.0–100.0)
Platelets: 311 10*3/uL (ref 150–400)
RDW: 19.3 % — ABNORMAL HIGH (ref 11.5–15.5)
WBC: 11.6 10*3/uL — ABNORMAL HIGH (ref 4.0–10.5)

## 2012-05-23 LAB — PRO B NATRIURETIC PEPTIDE: Pro B Natriuretic peptide (BNP): 354.3 pg/mL (ref 0–450)

## 2012-05-23 LAB — TYPE AND SCREEN
ABO/RH(D): A POS
Antibody Screen: NEGATIVE

## 2012-05-23 LAB — COMPREHENSIVE METABOLIC PANEL
ALT: 7 U/L (ref 0–35)
AST: 10 U/L (ref 0–37)
Calcium: 8.4 mg/dL (ref 8.4–10.5)
Sodium: 134 mEq/L — ABNORMAL LOW (ref 135–145)
Total Protein: 6.6 g/dL (ref 6.0–8.3)

## 2012-05-23 LAB — OCCULT BLOOD, POC DEVICE: Fecal Occult Bld: POSITIVE

## 2012-05-23 LAB — LACTIC ACID, PLASMA: Lactic Acid, Venous: 1.6 mmol/L (ref 0.5–2.2)

## 2012-05-23 LAB — CARDIAC PANEL(CRET KIN+CKTOT+MB+TROPI)
Relative Index: INVALID (ref 0.0–2.5)
Total CK: 35 U/L (ref 7–177)
Troponin I: <0.3 ng/mL (ref <0.30)

## 2012-05-23 LAB — APTT: aPTT: 36 seconds (ref 24–37)

## 2012-05-23 LAB — POCT I-STAT TROPONIN I: Troponin i, poc: 0 ng/mL (ref 0.00–0.08)

## 2012-05-23 MED ORDER — BRIMONIDINE TARTRATE-TIMOLOL 0.2-0.5 % OP SOLN: 1.0000 [drp] | Freq: Two times a day (BID) | OPHTHALMIC | Status: DC

## 2012-05-23 MED ORDER — POLYETHYLENE GLYCOL 3350 17 G PO PACK
17.0000 g | PACK | Freq: Every day | ORAL | Status: DC
  Administered 2012-05-24: 17 g via ORAL
  Filled 2012-05-23: qty 1

## 2012-05-23 MED ORDER — HYDROCODONE-ACETAMINOPHEN 5-325 MG PO TABS: 1.0000 | ORAL_TABLET | ORAL | Status: DC | PRN

## 2012-05-23 MED ORDER — SODIUM FLUORIDE 1.1 % DT PSTE: 1.0000 "application " | PASTE | Freq: Two times a day (BID) | DENTAL | Status: DC

## 2012-05-23 MED ORDER — ACETAMINOPHEN 500 MG PO TABS
1000.0000 mg | ORAL_TABLET | Freq: Three times a day (TID) | ORAL | Status: DC
  Administered 2012-05-24: 1000 mg via ORAL
  Filled 2012-05-23 (×3): qty 2

## 2012-05-23 MED ORDER — SODIUM CHLORIDE 0.9 % IV SOLN
8.0000 mg/h | INTRAVENOUS | Status: DC
  Administered 2012-05-23: 8 mg/h via INTRAVENOUS
  Filled 2012-05-23: qty 80

## 2012-05-23 MED ORDER — SODIUM CHLORIDE 0.9 % IV BOLUS (SEPSIS)
500.0000 mL | Freq: Once | INTRAVENOUS | Status: AC
  Administered 2012-05-23: 500 mL via INTRAVENOUS

## 2012-05-23 MED ORDER — OLOPATADINE HCL 0.1 % OP SOLN
1.0000 [drp] | Freq: Two times a day (BID) | OPHTHALMIC | Status: DC
  Administered 2012-05-24: 1 [drp] via OPHTHALMIC
  Filled 2012-05-23: qty 5

## 2012-05-23 MED ORDER — CARBAMIDE PEROXIDE 6.5 % OT SOLN: 3.0000 [drp] | OTIC | Status: DC

## 2012-05-23 MED ORDER — GABAPENTIN 100 MG PO CAPS
100.0000 mg | ORAL_CAPSULE | Freq: Every day | ORAL | Status: DC
  Filled 2012-05-23: qty 1

## 2012-05-23 MED ORDER — FLUTICASONE PROPIONATE HFA 44 MCG/ACT IN AERO
1.0000 | INHALATION_SPRAY | Freq: Two times a day (BID) | RESPIRATORY_TRACT | Status: DC
  Administered 2012-05-24: 1 via RESPIRATORY_TRACT
  Filled 2012-05-23: qty 10.6

## 2012-05-23 MED ORDER — TIMOLOL MALEATE 0.5 % OP SOLN
1.0000 [drp] | Freq: Two times a day (BID) | OPHTHALMIC | Status: DC
  Administered 2012-05-24: 1 [drp] via OPHTHALMIC
  Filled 2012-05-23: qty 5

## 2012-05-23 MED ORDER — PANTOPRAZOLE SODIUM 40 MG PO TBEC
40.0000 mg | DELAYED_RELEASE_TABLET | Freq: Every day | ORAL | Status: DC
  Administered 2012-05-24: 40 mg via ORAL
  Filled 2012-05-23: qty 1

## 2012-05-23 MED ORDER — RISPERIDONE 1 MG PO TABS
1.0000 mg | ORAL_TABLET | Freq: Every day | ORAL | Status: DC
  Filled 2012-05-23: qty 1

## 2012-05-23 MED ORDER — POLYSACCHARIDE IRON COMPLEX 150 MG PO CAPS
150.0000 mg | ORAL_CAPSULE | Freq: Every day | ORAL | Status: DC
  Filled 2012-05-23: qty 1

## 2012-05-23 MED ORDER — SODIUM CHLORIDE 0.9 % IJ SOLN
3.0000 mL | Freq: Two times a day (BID) | INTRAMUSCULAR | Status: DC
  Administered 2012-05-24: 3 mL via INTRAVENOUS

## 2012-05-23 MED ORDER — SODIUM CHLORIDE 0.9 % IV SOLN
80.0000 mg | Freq: Once | INTRAVENOUS | Status: AC
  Administered 2012-05-23: 80 mg via INTRAVENOUS
  Filled 2012-05-23: qty 80

## 2012-05-23 MED ORDER — ONDANSETRON HCL 4 MG PO TABS: 4.0000 mg | ORAL_TABLET | Freq: Four times a day (QID) | ORAL | Status: DC | PRN

## 2012-05-23 MED ORDER — LEVOTHYROXINE SODIUM 50 MCG PO TABS
50.0000 ug | ORAL_TABLET | Freq: Every day | ORAL | Status: DC
  Administered 2012-05-24: 50 ug via ORAL
  Filled 2012-05-23 (×2): qty 1

## 2012-05-23 MED ORDER — BRIMONIDINE TARTRATE 0.2 % OP SOLN
1.0000 [drp] | Freq: Two times a day (BID) | OPHTHALMIC | Status: DC
  Administered 2012-05-24: 1 [drp] via OPHTHALMIC
  Filled 2012-05-23: qty 5

## 2012-05-23 MED ORDER — HYPROMELLOSE 0.4 % OP SOLN: 1.0000 [drp] | Freq: Four times a day (QID) | OPHTHALMIC | Status: DC

## 2012-05-23 MED ORDER — ONDANSETRON HCL 4 MG/2ML IJ SOLN: 4.0000 mg | Freq: Three times a day (TID) | INTRAMUSCULAR | Status: DC | PRN

## 2012-05-23 MED ORDER — SODIUM CHLORIDE 0.9 % IV BOLUS (SEPSIS)
1000.0000 mL | Freq: Once | INTRAVENOUS | Status: AC
  Administered 2012-05-23: 1000 mL via INTRAVENOUS

## 2012-05-23 MED ORDER — SODIUM CHLORIDE 0.9 % IV SOLN
INTRAVENOUS | Status: AC
  Administered 2012-05-23 (×2): via INTRAVENOUS

## 2012-05-23 MED ORDER — POLYVINYL ALCOHOL 1.4 % OP SOLN
1.0000 [drp] | Freq: Four times a day (QID) | OPHTHALMIC | Status: DC
  Administered 2012-05-23 – 2012-05-24 (×3): 1 [drp] via OPHTHALMIC
  Filled 2012-05-23: qty 15

## 2012-05-23 MED ORDER — SODIUM CHLORIDE 0.9 % IV SOLN: INTRAVENOUS | Status: DC

## 2012-05-23 MED ORDER — NYSTATIN 100000 UNIT/GM EX POWD
Freq: Two times a day (BID) | CUTANEOUS | Status: DC
  Filled 2012-05-23: qty 15

## 2012-05-23 MED ORDER — BIMATOPROST 0.01 % OP SOLN
1.0000 [drp] | Freq: Every day | OPHTHALMIC | Status: DC
  Filled 2012-05-23: qty 2.5

## 2012-05-23 MED ORDER — ONDANSETRON HCL 4 MG/2ML IJ SOLN: 4.0000 mg | Freq: Four times a day (QID) | INTRAMUSCULAR | Status: DC | PRN

## 2012-05-23 NOTE — ED Notes (Signed)
Pt to [facility] via EMS from facility due to fall from a seated position, which was unwitnesed. Pt was found by staff on the floor, unresponsive with no obvious injury anywhere. She eventually aroused, but was disoriented x4 with inappropriate speech. Of note, pt does have some baseline dementia. Her BP was reported to be 70/40 by EMS at the time of incident. EMS was activated at that time, son was notified and decision was made to bring pt in for evaluation.

## 2012-05-23 NOTE — ED Notes (Signed)
Latest BP 77/41. Pt more alert. ED MD notified.

## 2012-05-23 NOTE — ED Notes (Signed)
Son contacted by phone and updated by this RN and ED MD at bedside.

## 2012-05-23 NOTE — ED Provider Notes (Signed)
History     CSN: [id number]  Arrival date & time 05/23/12  1549   First MD Initiated Contact with Patient 05/23/12 1627      Chief Complaint  Patient presents with  . Fall    unwitnessed  . Altered Mental Status  . Hypotension    (Consider location/radiation/quality/duration/timing/severity/associated sxs/prior treatment) HPI Comments: Level V caveat due to dementia. Presents from her nursing home after a reported fall from a sitting position. She was found to have a blood pressure of 70/40. Patient with depressed mental status. History of dementia. Complains of no pain. DO NOT RESUSCITATE.  Patient is a 78 y.o. female presenting with fall. The history is provided by the EMS personnel and the nursing home. No language interpreter was used.  Fall The accident occurred less than 1 hour ago. She fell from a height of 1 to 2 ft. She landed on carpet. The patient is experiencing no pain. She was not ambulatory at the scene. There was no entrapment after the fall. There was no drug use involved in the accident. There was no alcohol use involved in the accident. She has tried nothing for the symptoms.    Past Medical History  Diagnosis Date  . Dementia   . Anemia   . Hypercholesteremia   . Peripheral neuropathy   . GERD (gastroesophageal reflux disease)   . Restless leg syndrome   . Diverticular disease   . Incontinence     History reviewed. No pertinent past surgical history.  History reviewed. No pertinent family history.  History  Substance Use Topics  . Smoking status: Never Smoker   . Smokeless tobacco: Not on file  . Alcohol Use: No    OB History    Grav Para Term Preterm Abortions TAB SAB Ect Mult Living                  Review of Systems  Unable to perform ROS: Dementia  Psychiatric/Behavioral: Positive for altered mental status.    Allergies  Review of patient's allergies indicates no known allergies.  Home Medications   Current Outpatient Rx  Name  Route Sig Dispense Refill  . ACETAMINOPHEN 500 MG PO TABS Oral Take 1,000 mg by mouth every 8 (eight) hours. Scheduled 0800, 1400, 2000.    Marland Kitchen BECLOMETHASONE DIPROPIONATE 80 MCG/ACT IN AERS Inhalation Inhale 2 puffs into the lungs 2 (two) times daily.    Marland Kitchen BIMATOPROST 0.01 % OP SOLN Both Eyes Place 1 drop into both eyes at bedtime.    Marland Kitchen BRIMONIDINE TARTRATE-TIMOLOL 0.2-0.5 % OP SOLN Both Eyes Place 1 drop into both eyes every 12 (twelve) hours.    . CARBAMIDE PEROXIDE 6.5 % OT SOLN Both Ears Place 3 drops into both ears every 30 (thirty) days. On the first day of each month.    . OMEGA-3 FATTY ACIDS 1000 MG PO CAPS Oral Take 1 g by mouth 2 (two) times daily.    Marland Kitchen GABAPENTIN 100 MG PO CAPS Oral Take 100 mg by mouth daily.    Marland Kitchen HYPROMELLOSE 0.4 % OP SOLN Both Eyes Place 1 drop into both eyes 4 (four) times daily.    Marland Kitchen POLYSACCHARIDE IRON COMPLEX 150 MG PO CAPS Oral Take 150 mg by mouth daily.    Marland Kitchen LEVOTHYROXINE SODIUM 50 MCG PO TABS Oral Take 50 mcg by mouth daily.    . NYSTATIN 100000 UNIT/GM EX POWD Topical Apply topically. To affected areas (unspecified).    . OLOPATADINE HCL 0.1 % OP SOLN  Both Eyes Place 1 drop into both eyes 2 (two) times daily.    Marland Kitchen OMEPRAZOLE 20 MG PO CPDR Oral Take 20 mg by mouth daily.    Marland Kitchen POLYETHYLENE GLYCOL 3350 PO PACK Oral Take 17 g by mouth daily.    Marland Kitchen RISPERIDONE 1 MG PO TABS Oral Take 1 mg by mouth daily.    . SODIUM FLUORIDE 1.1 % DT PSTE dental Place 1 application onto teeth 2 (two) times daily.    Marland Kitchen VITAMIN B-12 1000 MCG PO TABS Oral Take 1,000 mcg by mouth daily.      BP 95/73  Pulse 73  Temp(Src) 97.4 F (36.3 C) (Oral)  Resp 17  SpO2 99%  Physical Exam  Nursing note and vitals reviewed. Constitutional: She appears well-developed and well-nourished.       Decreased mental status  HENT:  Head: Normocephalic and atraumatic.  Mouth/Throat: Oropharynx is clear and moist. No oropharyngeal exudate.  Eyes: Conjunctivae and EOM are normal. Pupils are  equal, round, and reactive to light.  Neck: Normal range of motion. Neck supple.  Cardiovascular: Normal rate, regular rhythm, normal heart sounds and intact distal pulses.  Exam reveals no gallop and no friction rub.   No murmur heard. Pulmonary/Chest: Effort normal and breath sounds normal. No respiratory distress. She exhibits no tenderness.  Abdominal: Soft. Bowel sounds are normal. There is no tenderness. There is no rebound and no guarding.  Musculoskeletal: Normal range of motion. She exhibits no edema and no tenderness.  Neurological: She is alert. She has normal strength. No cranial nerve deficit or sensory deficit.       confused  Skin: Skin is warm and dry.    ED Course  Procedures (including critical care time)  CRITICAL CARE Performed by: Dayton Bailiff   Total critical care time: 30 min  Critical care time was exclusive of separately billable procedures and treating other patients.  Critical care was necessary to treat or prevent imminent or life-threatening deterioration.  Critical care was time spent personally by me on the following activities: development of treatment plan with patient and/or surrogate as well as nursing, discussions with consultants, evaluation of patient's response to treatment, examination of patient, obtaining history from patient or surrogate, ordering and performing treatments and interventions, ordering and review of laboratory studies, ordering and review of radiographic studies, pulse oximetry and re-evaluation of patient's condition.   Date: 05/23/2012  Rate: 73  Rhythm: normal sinus rhythm  QRS Axis: normal  Intervals: normal  ST/T Wave abnormalities: normal  Conduction Disutrbances:none  Narrative Interpretation:   Old EKG Reviewed: unchanged  Labs Reviewed  CBC - Abnormal; Notable for the following:    WBC 11.6 (*)    Hemoglobin 9.9 (*)    HCT 32.1 (*)    MCH 25.3 (*)    RDW 19.3 (*)    All other components within normal limits   DIFFERENTIAL - Abnormal; Notable for the following:    Monocytes Absolute 1.1 (*)    All other components within normal limits  COMPREHENSIVE METABOLIC PANEL - Abnormal; Notable for the following:    Sodium 134 (*)    Albumin 2.5 (*)    GFR calc non Af Amer 88 (*)    All other components within normal limits  URINALYSIS, ROUTINE W REFLEX MICROSCOPIC  PRO B NATRIURETIC PEPTIDE  PROTIME-INR  APTT  LACTIC ACID, PLASMA  POCT I-STAT TROPONIN I  OCCULT BLOOD, POC DEVICE  URINE CULTURE  TYPE AND SCREEN   Ct Head  Wo Contrast  05/23/2012  *RADIOLOGY REPORT*  Clinical Data: Demented patient with falls.  CT HEAD WITHOUT CONTRAST  Technique:  Contiguous axial images were obtained from the base of the skull through the vertex without contrast.  Comparison: 10/03/2010  Findings: Bone windows demonstrate no significant soft tissue swelling.  Clear paranasal sinuses and mastoid air cells.  Soft tissue windows demonstrate moderate to marked low density in the periventricular white matter likely related to small vessel disease.Physiologic calcifications in the basal ganglia. Cerebellar and cerebral atrophy. No  mass lesion, hemorrhage, hydrocephalus, acute infarct, intra-axial, or extra-axial fluid collection.  IMPRESSION:  1. No acute intracranial abnormality. 2. Cerebral atrophy and small vessel ischemic change.  Original Report Authenticated By: Consuello Bossier, M.D.   Dg Chest Port 1 View  05/23/2012  *RADIOLOGY REPORT*  Clinical Data: Fall, weakness  PORTABLE CHEST - 1 VIEW  Comparison: 05/05/2012  Findings: Cardiomediastinal silhouette is stable.  No acute infiltrate or pulmonary edema.  Dystrophic calcifications bilateral shoulders again noted.  No diagnostic pneumothorax.  Probable bilateral nipple shadows.  IMPRESSION: No active disease.  No diagnostic pneumothorax.  Original Report Authenticated By: Natasha Mead, M.D.     1. Altered mental status   2. Anemia   3. GI bleed       MDM  GI bleed  with syncopal event. Altered mental status. EKG relatively unremarkable. Place on protonic strip. Melanotic stool. Will be [facility] to [facility] with telemetry bed. And troponin is negative. Unlikely cardiac in etiology.  Mental status waxes and wanes        Dayton Bailiff, MD 05/23/12 1826

## 2012-05-23 NOTE — H&P (Signed)
PCP:  Astrid Divine, MD, MD   DOA:  05/23/2012  4:09 PM  Chief Complaint:  Syncope and change in mental status  HPI: Pt is 78 yo female with progressive dementia who was brought by EMS from skilled nursing facility secondary to sudden onset of change in mental status and syncopal episode. Pt is unable to provide history due to dementia and the details in this HPI are mostly obtained from EMS records and ED physician. Apparently pt was at her usual state of health and approximately 1 hour prior to arrival to ED she has fallen from the sitting position to the floor and at that time was found to have a BP 70/40. Pt was alert at the time of the event and reported no pain. At the time of this visit pt also denies any pain, no chest pain, no shortness or breath, no specific abdominal or urinary concerns, she denies any headaches but again is very poor historian due to underlying dementia.   Allergies: No Known Allergies  Prior to Admission medications   Medication Sig Start Date End Date Taking? Authorizing Provider  acetaminophen (TYLENOL) 500 MG tablet Take 1,000 mg by mouth every 8 (eight) hours. Scheduled 0800, 1400, 2000.   Yes Historical Provider, MD  beclomethasone (QVAR) 80 MCG/ACT inhaler Inhale 2 puffs into the lungs 2 (two) times daily.   Yes Historical Provider, MD  bimatoprost (LUMIGAN) 0.01 % SOLN Place 1 drop into both eyes at bedtime.   Yes Historical Provider, MD  brimonidine-timolol (COMBIGAN) 0.2-0.5 % ophthalmic solution Place 1 drop into both eyes every 12 (twelve) hours.   Yes Historical Provider, MD  carbamide peroxide (DEBROX) 6.5 % otic solution Place 3 drops into both ears every 30 (thirty) days. On the first day of each month.   Yes Historical Provider, MD  fish oil-omega-3 fatty acids 1000 MG capsule Take 1 g by mouth 2 (two) times daily.   Yes Historical Provider, MD  gabapentin (NEURONTIN) 100 MG capsule Take 100 mg by mouth daily.   Yes Historical Provider, MD    Hypromellose (NATURAL BALANCE TEARS) 0.4 % SOLN Place 1 drop into both eyes 4 (four) times daily.   Yes Historical Provider, MD  iron polysaccharides (NIFEREX) 150 MG capsule Take 150 mg by mouth daily.   Yes Historical Provider, MD  levothyroxine (SYNTHROID, LEVOTHROID) 50 MCG tablet Take 50 mcg by mouth daily.   Yes Historical Provider, MD  nystatin (MYCOSTATIN/NYSTOP) 100000 UNIT/GM POWD Apply topically. To affected areas (unspecified).   Yes Historical Provider, MD  olopatadine (PATANOL) 0.1 % ophthalmic solution Place 1 drop into both eyes 2 (two) times daily.   Yes Historical Provider, MD  omeprazole (PRILOSEC) 20 MG capsule Take 20 mg by mouth daily.   Yes Historical Provider, MD  polyethylene glycol (MIRALAX / GLYCOLAX) packet Take 17 g by mouth daily.   Yes Historical Provider, MD  risperiDONE (RISPERDAL) 1 MG tablet Take 1 mg by mouth daily.   Yes Historical Provider, MD  Sodium Fluoride (PREVIDENT 5000 DRY MOUTH) 1.1 % PSTE Place 1 application onto teeth 2 (two) times daily.   Yes Historical Provider, MD  vitamin B-12 (CYANOCOBALAMIN) 1000 MCG tablet Take 1,000 mcg by mouth daily.   Yes Historical Provider, MD    Past Medical History  Diagnosis Date  . Dementia   . Anemia   . Hypercholesteremia   . Peripheral neuropathy   . GERD (gastroesophageal reflux disease)   . Restless leg syndrome   . Diverticular disease   .  Incontinence     History reviewed. No pertinent past surgical history.  Social History:  reports that she has never smoked. She does not have any smokeless tobacco history on file. She reports that she does not drink alcohol or use illicit drugs.  History reviewed. No pertinent family history.  Review of Systems:  Unable to obtain details as pt has rather advanced degree of dementia  Physical Exam:  Filed Vitals:   05/23/12 1352 05/23/12 1549 05/23/12 1628 05/23/12 1810  BP: 90/33 70/40  95/73  Pulse: 77   73  Temp: 97.4 F (36.3 C)     TempSrc: Oral      Resp:  15  17  SpO2: 100% 100% 99% 99%    Constitutional: Vital signs reviewed.  Patient is in no acute distress and cooperative with exam. Alert but oriented only to name. Head: Normocephalic and atraumatic Ear: TM normal bilaterally Mouth: no erythema or exudates, MMM Eyes: PERRL, EOMI, conjunctivae normal, No scleral icterus.  Neck: Supple, Trachea midline normal ROM, No JVD, mass, thyromegaly, or carotid bruit present.  Cardiovascular: RRR, S1 normal, S2 normal, no MRG, pulses symmetric and intact bilaterally Pulmonary/Chest: CTAB but slightly decreased at bases, no wheezes, rales, or rhonchi Abdominal: Soft. Non-tender, non-distended, bowel sounds are normal, no masses, organomegaly, or guarding present.  GU: no CVA tenderness Musculoskeletal: No joint deformities, erythema, or stiffness, ROM full and no nontender Ext: no edema and no cyanosis, pulses palpable bilaterally (DP and PT) Hematology: no cervical, inginal, or axillary adenopathy.  Neurological: A&O to name only, Strenght is normal and symmetric bilaterally, cranial nerve II-XII are grossly intact, no focal motor deficit, sensory intact to light touch bilaterally. Pt follows commands appropriately.  Skin: Warm, dry and intact. No rash, cyanosis, or clubbing.   Labs on Admission:  Results for orders placed during the hospital encounter of 05/23/12 (from the past 48 hour(s))  CBC     Status: Abnormal   Collection Time   05/23/12  4:52 PM      Component Value Range Comment   WBC 11.6 (*) 4.0 - 10.5 (K/uL)    RBC 3.91  3.87 - 5.11 (MIL/uL)    Hemoglobin 9.9 (*) 12.0 - 15.0 (g/dL)    HCT 16.1 (*) 09.6 - 46.0 (%)    MCV 82.1  78.0 - 100.0 (fL)    MCH 25.3 (*) 26.0 - 34.0 (pg)    MCHC 30.8  30.0 - 36.0 (g/dL)    RDW 04.5 (*) 40.9 - 15.5 (%)    Platelets 311  150 - 400 (K/uL)   DIFFERENTIAL     Status: Abnormal   Collection Time   05/23/12  4:52 PM      Component Value Range Comment   Neutrophils Relative 65  43 - 77  (%)    Neutro Abs 7.5  1.7 - 7.7 (K/uL)    Lymphocytes Relative 23  12 - 46 (%)    Lymphs Abs 2.6  0.7 - 4.0 (K/uL)    Monocytes Relative 10  3 - 12 (%)    Monocytes Absolute 1.1 (*) 0.1 - 1.0 (K/uL)    Eosinophils Relative 3  0 - 5 (%)    Eosinophils Absolute 0.4  0.0 - 0.7 (K/uL)    Basophils Relative 0  0 - 1 (%)    Basophils Absolute 0.0  0.0 - 0.1 (K/uL)   COMPREHENSIVE METABOLIC PANEL     Status: Abnormal   Collection Time   05/23/12  4:52 PM  Component Value Range Comment   Sodium 134 (*) 135 - 145 (mEq/L)    Potassium 4.4  3.5 - 5.1 (mEq/L)    Chloride 102  96 - 112 (mEq/L)    CO2 21  19 - 32 (mEq/L)    Glucose, Bld 97  70 - 99 (mg/dL)    BUN 16  6 - 23 (mg/dL)    Creatinine, Ser 0.98  0.50 - 1.10 (mg/dL)    Calcium 8.4  8.4 - 10.5 (mg/dL)    Total Protein 6.6  6.0 - 8.3 (g/dL)    Albumin 2.5 (*) 3.5 - 5.2 (g/dL)    AST 10  0 - 37 (U/L)    ALT 7  0 - 35 (U/L)    Alkaline Phosphatase 89  39 - 117 (U/L)    Total Bilirubin 0.3  0.3 - 1.2 (mg/dL)    GFR calc non Af Amer 88 (*) >90 (mL/min)    GFR calc Af Amer >90  >90 (mL/min)   PRO B NATRIURETIC PEPTIDE     Status: Normal   Collection Time   05/23/12  4:52 PM      Component Value Range Comment   Pro B Natriuretic peptide (BNP) 354.3  0 - 450 (pg/mL)   PROTIME-INR     Status: Normal   Collection Time   05/23/12  4:52 PM      Component Value Range Comment   Prothrombin Time 14.1  11.6 - 15.2 (seconds)    INR 1.07  0.00 - 1.49    APTT     Status: Normal   Collection Time   05/23/12  4:52 PM      Component Value Range Comment   aPTT 36  24 - 37 (seconds)   LACTIC ACID, PLASMA     Status: Normal   Collection Time   05/23/12  4:52 PM      Component Value Range Comment   Lactic Acid, Venous 1.6  0.5 - 2.2 (mmol/L)   POCT I-STAT TROPONIN I     Status: Normal   Collection Time   05/23/12  4:58 PM      Component Value Range Comment   Troponin i, poc 0.00  0.00 - 0.08 (ng/mL)    Comment 3            URINALYSIS, ROUTINE W  REFLEX MICROSCOPIC     Status: Normal   Collection Time   05/23/12  6:02 PM      Component Value Range Comment   Color, Urine YELLOW  YELLOW     APPearance CLEAR  CLEAR     Specific Gravity, Urine 1.013  1.005 - 1.030     pH 7.5  5.0 - 8.0     Glucose, UA NEGATIVE  NEGATIVE (mg/dL)    Hgb urine dipstick NEGATIVE  NEGATIVE     Bilirubin Urine NEGATIVE  NEGATIVE     Ketones, ur NEGATIVE  NEGATIVE (mg/dL)    Protein, ur NEGATIVE  NEGATIVE (mg/dL)    Urobilinogen, UA 1.0  0.0 - 1.0 (mg/dL)    Nitrite NEGATIVE  NEGATIVE     Leukocytes, UA NEGATIVE  NEGATIVE  MICROSCOPIC NOT DONE ON URINES WITH NEGATIVE PROTEIN, BLOOD, LEUKOCYTES, NITRITE, OR GLUCOSE <1000 mg/dL.  OCCULT BLOOD, POC DEVICE     Status: Normal   Collection Time   05/23/12  6:08 PM      Component Value Range Comment   Fecal Occult Bld POSITIVE       Radiological  Exams on Admission:  CT HEAD: 05/23/2012 No acute findings but chronic changes noted.  CXR: 05/23/2012 No acute cardiopulmonary findings   Assessment/Plan  Change in mental status - unclear etiology and perhaps multifactorial, secondary to hypotension, progressive failure to thrive, dehydration - will admit the pt to telemetry floor for further evaluation and management - will obtain 12 lead EKG and will cycle cardiac enzymes every 8 hour - will check TSH, follow up on electrolyte panel including Mg and phosphate levels - start IVF but gentle hydration, encourage PO intake if pt tolerates - PT evaluation in AM  Syncope - unclear etiology at this time - pt is currently hemodynamically stable - will monitor on telemetry unit - cycle cardiac enzymes and check TSH, 12 lead EKG  Hypotension - will continue IVF - Monitor vitals per floor protocol - will check orthostatic vitals  Anemia, acute blood loss - imposed on chronic anemia - please note that FOBT is positive - Hg and Hct appear to be at pt's baseline - will check CBC in AM - no transfusion  indicated at this time  Hyponatremia - mild and likely secondary to dehydration - continue IVF - BMP in AM  Leukocytosis - pt is afebrile and no acute findings suggestive of an infectious etiology - CXR and UA unremarkable - CBC in AM  DVT Prophylaxis - SCD  Code Status - DNI/DNR  Time Spent on Admission: Over 30 minutes  MAGICK-Sharon Fletcher 05/23/2012, 6:49 PM  [facility] Pager # [phone] Main Office # [phone]

## 2012-05-23 NOTE — ED Notes (Signed)
WUJ:[id number]<JY> Expected date:05/23/12<BR> Expected time: 3:35 PM<BR> Means of arrival:Ambulance<BR> Comments:<BR> M100. 81 f. From facility. Low bp, lethargic. DNR. 10 mins

## 2012-05-24 ENCOUNTER — Other Ambulatory Visit: Payer: Self-pay

## 2012-05-24 DIAGNOSIS — I959 Hypotension, unspecified: Secondary | ICD-10-CM

## 2012-05-24 DIAGNOSIS — R55 Syncope and collapse: Secondary | ICD-10-CM

## 2012-05-24 DIAGNOSIS — R4182 Altered mental status, unspecified: Secondary | ICD-10-CM

## 2012-05-24 DIAGNOSIS — R112 Nausea with vomiting, unspecified: Secondary | ICD-10-CM

## 2012-05-24 LAB — BASIC METABOLIC PANEL
CO2: 21 mEq/L (ref 19–32)
GFR calc non Af Amer: >90 mL/min (ref >90)
Glucose, Bld: 104 mg/dL — ABNORMAL HIGH (ref 70–99)
Potassium: 3.7 mEq/L (ref 3.5–5.1)
Sodium: 137 mEq/L (ref 135–145)

## 2012-05-24 LAB — CBC
Hemoglobin: 9.1 g/dL — ABNORMAL LOW (ref 12.0–15.0)
MCH: 25.2 pg — ABNORMAL LOW (ref 26.0–34.0)
RBC: 3.61 MIL/uL — ABNORMAL LOW (ref 3.87–5.11)

## 2012-05-24 LAB — CARDIAC PANEL(CRET KIN+CKTOT+MB+TROPI): Relative Index: INVALID (ref 0.0–2.5)

## 2012-05-24 LAB — TSH: TSH: 2.297 u[IU]/mL (ref 0.350–4.500)

## 2012-05-24 NOTE — Discharge Summary (Signed)
[facility]                                                                                   Sharon Fletcher, is a 78 y.o. female  DOB [birth year]  MRN [id number].  Admission date:  05/23/2012  Discharge Date:  05/24/2012  Primary MD  Astrid Divine, MD, MD  Admitting Physician  Mosetta Pigeon, MD  Admission Diagnosis  Syncope [780.2] Altered mental status [780.97] Anemia [285.9] GI bleed [578.9] hypotension and lethargy  Discharge Diagnosis     Hypotension due to dehydration, advanced dementia.  Comfort Care only no heroics to be attempted avoid further hospitalization discussed with son in detail he insists on this plan.    Past Medical History  Diagnosis Date  . Dementia   . Anemia   . Hypercholesteremia   . Peripheral neuropathy   . GERD (gastroesophageal reflux disease)   . Restless leg syndrome   . Diverticular disease   . Incontinence     History reviewed. No pertinent past surgical history.   Hospital Course See H&P, Labs, Consult and Test reports for all details in brief,  patient with advanced dementia, poor quality of life, nursing home resident, with the recent hospitalizations due to hypotension, was admitted again to our hospital from nursing facility where she reportedly slumped from a chair, was found to be hypotensive and rushed to the hospital. In the ER patient was confirmed to be hypotensive, head CT and initial labs were unremarkable, she was admitted to the hospital for hypotension. After gentle IV fluids her hypotension has resolved. In my opinion this is a recurrent problem due to decreased by mouth intake from patient's dementia. I discussed her care with her son in detail over the phone who was insistent that he would rather have his mother be treated for comfort at the nursing facility and not rushed to the hospital for medical issues. Which I concur with. At this point I will request the nursing staff to involve Comfort  Care/hospice in patient's care with goal being comfort only. Further hospitalizations should be avoided unless him for becomes an issue. Again this has been discussed with patient's son and he insists on this plan.  Comfort Care only no heroics to be attempted avoid further hospitalization discussed with son in detail he insists on this plan.    Consults  None  Significant Tests:  See full reports for all details     Dg Chest 2 View  05/05/2012  *RADIOLOGY REPORT*  Clinical Data: Hypotension.  Acute mental status changes.  CHEST - 2 VIEW  Comparison: None.  Findings: Suboptimal inspiration accounts for atelectasis in the lower lobes and lingula.  Cardiac silhouette mildly enlarged. Pulmonary venous hypertension without overt edema.  Lungs otherwise clear.  No pleural effusions.  Thoracic aorta atherosclerotic. Hilar and mediastinal contours otherwise unremarkable. Degenerative changes involving the thoracic spine.  Degenerative changes in both shoulders with calcified loose bodies in the left shoulder joint.  IMPRESSION: Suboptimal inspiration accounts for atelectasis in the lower lobes and lingula.  No acute cardiopulmonary disease otherwise.  Mild cardiomegaly without pulmonary edema.  Original Report Authenticated By: Arnell Sieving, M.D.  Ct Head Wo Contrast  05/23/2012  *RADIOLOGY REPORT*  Clinical Data: Demented patient with falls.  CT HEAD WITHOUT CONTRAST  Technique:  Contiguous axial images were obtained from the base of the skull through the vertex without contrast.  Comparison: 10/03/2010  Findings: Bone windows demonstrate no significant soft tissue swelling.  Clear paranasal sinuses and mastoid air cells.  Soft tissue windows demonstrate moderate to marked low density in the periventricular white matter likely related to small vessel disease.Physiologic calcifications in the basal ganglia. Cerebellar and cerebral atrophy. No  mass lesion, hemorrhage, hydrocephalus, acute infarct,  intra-axial, or extra-axial fluid collection.  IMPRESSION:  1. No acute intracranial abnormality. 2. Cerebral atrophy and small vessel ischemic change.  Original Report Authenticated By: Consuello Bossier, M.D.   Ct Angio Chest W/cm &/or Wo Cm  05/05/2012  *RADIOLOGY REPORT*  Clinical Data: Hypotension  CT ANGIOGRAPHY CHEST  Technique:  Multidetector CT imaging of the chest using the standard protocol during bolus administration of intravenous contrast. Multiplanar reconstructed images including MIPs were obtained and reviewed to evaluate the vascular anatomy.  Contrast:  100 ml Omnipaque 350  Comparison: None.  Findings: Respiratory motion artifact limits the study.  No obvious filling defects in the pulmonary arterial tree to suggest acute pulmonary thromboembolism.  Negative abnormal mediastinal adenopathy.  Sub centimeter short axis diameter precarinal node.  No pericardial effusion  No pneumothorax.  Tiny pleural effusions are present.  Patchy airspace disease in the posterior basal left lower lobe. There is a branching opacity in the right middle lobe worrisome for mucoid impaction within a dilated peripheral bronchi.  This may also represent an unusual appearance of airspace disease or pulmonary mass.  He is to 0.6 x 0.9 cm on image 53.  Severe degenerative change of both glenohumeral joints. 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  IMPRESSION: No evidence of acute pulmonary thromboembolism.  Branching opacity in the right middle lobe as described which may simply represent mucoid impaction within a dilated peripheral airway.  Pulmonary mass or airspace disease to have a similar appearance.  At the minimum, 31-month follow-up is recommended to ensure resolution.  Alternatively, PET CT can be performed.  Small pleural effusions  Patchy left lower lobe airspace disease.  Original Report Authenticated By: Donavan Burnet, M.D.   Dg Chest Port 1 View  05/23/2012  *RADIOLOGY REPORT*  Clinical  Data: Fall, weakness  PORTABLE CHEST - 1 VIEW  Comparison: 05/05/2012  Findings: Cardiomediastinal silhouette is stable.  No acute infiltrate or pulmonary edema.  Dystrophic calcifications bilateral shoulders again noted.  No diagnostic pneumothorax.  Probable bilateral nipple shadows.  IMPRESSION: No active disease.  No diagnostic pneumothorax.  Original Report Authenticated By: Natasha Mead, M.D.     Today   Subjective:   Sharon Fletcher today  lying in hospital bed in no distress will not answer questions due to her advanced dementia.  Objective:   Blood pressure 142/82, pulse 81, temperature 98.1 F (36.7 C), temperature source Oral, resp. rate 18, height 5\' 4"  (1.626 m), weight 72.576 kg (160 lb), SpO2 97.00%.  Intake/Output Summary (Last 24 hours) at 05/24/12 0745 Last data filed at 05/24/12 0000  Gross per 24 hour  Intake    999 ml  Output    900 ml  Net     99 ml    Exam Elderly Caucasian female lying in hospital bed in no distress, will open eyes to verbal commands but not answer questions moves all 4 extremities right side more than  left, has advanced underlying dementia,   Supple Neck,No JVD, No cervical lymphadenopathy appriciated.  Symmetrical Chest wall movement, Good air movement bilaterally, CTAB RRR,No Gallops,Rubs or new Murmurs, No Parasternal Heave +ve B.Sounds, Abd Soft, Non tender, No organomegaly appriciated, No rebound -guarding or rigidity. No Cyanosis, Clubbing or edema, No new Rash or bruise  Data Review     CBC w Diff: Lab Results  Component Value Date   WBC 7.6 05/24/2012   HGB 9.1* 05/24/2012   HCT 30.1* 05/24/2012   PLT 290 05/24/2012   LYMPHOPCT 23 05/23/2012   MONOPCT 10 05/23/2012   EOSPCT 3 05/23/2012   BASOPCT 0 05/23/2012    CMP: Lab Results  Component Value Date   NA 137 05/24/2012   K 3.7 05/24/2012   CL 106 05/24/2012   CO2 21 05/24/2012   BUN 14 05/24/2012   CREATININE 0.41* 05/24/2012   PROT 6.6 05/23/2012   ALBUMIN 2.5* 05/23/2012   BILITOT  0.3 05/23/2012   ALKPHOS 89 05/23/2012   AST 10 05/23/2012   ALT 7 05/23/2012  .   Discharge Instructions     Follow with Primary MD Astrid Divine, MD, MD in 7 days , Goal of care is comfort only.   Get Medicines reviewed and adjusted.  Please request your Prim.MD to go over all Hospital Tests and Procedure/Radiological results at the follow up, please get all Hospital records sent to your Prim MD by signing hospital release before you go home.  Activity: As tolerated with Full fall precautions use walker/cane & assistance as needed  Diet: Heart Healthy with full assistance- head of the bed elevated to 60 and with full Aspiration precautions.  For Heart failure patients - Check your Weight same time everyday, if you gain over 2 pounds, or you develop in leg swelling, experience more shortness of breath or chest pain, call your Primary MD immediately. Follow Cardiac Low Salt Diet and 1.8 lit/day fluid restriction.  Disposition SNF  Follow-up Information    Follow up with Astrid Divine, MD. Schedule an appointment as soon as possible for a visit in 1 week.   Contact information:   [address], [address] [place] [address] [phone]          Discharge Medications    Sharon Fletcher, Sharon Fletcher  Home Medication Instructions FAO:[id number]   Printed on:05/24/12 0745  Medication Information                    risperiDONE (RISPERDAL) 1 MG tablet Take 1 mg by mouth daily.           nystatin (MYCOSTATIN/NYSTOP) 100000 UNIT/GM POWD Apply topically. To affected areas (unspecified).           bimatoprost (LUMIGAN) 0.01 % SOLN Place 1 drop into both eyes at bedtime.           polyethylene glycol (MIRALAX / GLYCOLAX) packet Take 17 g by mouth daily.           vitamin B-12 (CYANOCOBALAMIN) 1000 MCG tablet Take 1,000 mcg by mouth daily.           brimonidine-timolol (COMBIGAN) 0.2-0.5 % ophthalmic solution Place 1 drop into both eyes every 12  (twelve) hours.           fish oil-omega-3 fatty acids 1000 MG capsule Take 1 g by mouth 2 (two) times daily.           olopatadine (PATANOL) 0.1 % ophthalmic solution Place 1 drop into  both eyes 2 (two) times daily.           beclomethasone (QVAR) 80 MCG/ACT inhaler Inhale 2 puffs into the lungs 2 (two) times daily.           Hypromellose (NATURAL BALANCE TEARS) 0.4 % SOLN Place 1 drop into both eyes 4 (four) times daily.           Sodium Fluoride (PREVIDENT 5000 DRY MOUTH) 1.1 % PSTE Place 1 application onto teeth 2 (two) times daily.           carbamide peroxide (DEBROX) 6.5 % otic solution Place 3 drops into both ears every 30 (thirty) days. On the first day of each month.           levothyroxine (SYNTHROID, LEVOTHROID) 50 MCG tablet Take 50 mcg by mouth daily.           omeprazole (PRILOSEC) 20 MG capsule Take 20 mg by mouth daily.           iron polysaccharides (NIFEREX) 150 MG capsule Take 150 mg by mouth daily.           gabapentin (NEURONTIN) 100 MG capsule Take 100 mg by mouth daily.           acetaminophen (TYLENOL) 500 MG tablet Take 1,000 mg by mouth every 8 (eight) hours. Scheduled 0800, 1400, 2000.              Total Time in preparing paper work, data evaluation and todays exam - 35 minutes  Leroy Sea M.D on 05/24/2012 at 7:45 AM  [facility] Office  [phone]

## 2012-05-24 NOTE — Progress Notes (Signed)
Report called to nurse at [facility]

## 2012-05-24 NOTE — Discharge Instructions (Signed)
Follow with Primary MD Astrid Divine, MD, MD in 7 days , Goal of care is comfort only.   Get Medicines reviewed and adjusted.  Please request your Prim.MD to go over all Hospital Tests and Procedure/Radiological results at the follow up, please get all Hospital records sent to your Prim MD by signing hospital release before you go home.  Activity: As tolerated with Full fall precautions use walker/cane & assistance as needed  Diet: Heart Healthy with full assistance- head of the bed elevated to 60 and with full Aspiration precautions.  For Heart failure patients - Check your Weight same time everyday, if you gain over 2 pounds, or you develop in leg swelling, experience more shortness of breath or chest pain, call your Primary MD immediately. Follow Cardiac Low Salt Diet and 1.8 lit/day fluid restriction.  Disposition SNF

## 2012-05-24 NOTE — Progress Notes (Signed)
Patient discharged to [facility] in stable condition. Transported via ambulance.

## 2012-05-24 NOTE — Progress Notes (Addendum)
Patient is from [facility] and is cleared for discharge. Patient has traditional medicare and does not have a three night qualifying stay. CSW called crystal at [employer] and left voicemail requesting call back.  Paizleigh Wilds C. Malajah Oceguera MSW, LCSW [phone] Packet copied and placed in [place]. ptar called for transportation. Voicemail left for patients son.  Jovita Persing C. Horatio Bertz MSW, LCSW [phone]

## 2012-05-24 NOTE — Progress Notes (Signed)
05/24/12 0800  PT Visit Information  Last PT Received On 05/24/12  Reason Eval/Treat Not Completed Other (comment) (pt is for comfort care. PT not indicated)

## 2012-05-25 LAB — URINE CULTURE: Culture  Setup Time: 201306092216

## 2013-04-18 ENCOUNTER — Encounter (HOSPITAL_COMMUNITY): Payer: Self-pay | Admitting: Emergency Medicine

## 2013-04-18 ENCOUNTER — Emergency Department (HOSPITAL_COMMUNITY)
Admission: EM | Admit: 2013-04-18 | Discharge: 2013-04-18 | Disposition: A | Discharge status: Home / Self Care | Payer: Medicare Other | Attending: Emergency Medicine | Admitting: Emergency Medicine

## 2013-04-18 ENCOUNTER — Emergency Department (HOSPITAL_COMMUNITY): Payer: Medicare Other

## 2013-04-18 DIAGNOSIS — Z79899 Other long term (current) drug therapy: Secondary | ICD-10-CM | POA: Insufficient documentation

## 2013-04-18 DIAGNOSIS — K5730 Diverticulosis of large intestine without perforation or abscess without bleeding: Secondary | ICD-10-CM | POA: Insufficient documentation

## 2013-04-18 DIAGNOSIS — K219 Gastro-esophageal reflux disease without esophagitis: Secondary | ICD-10-CM | POA: Diagnosis not present

## 2013-04-18 DIAGNOSIS — W19XXXA Unspecified fall, initial encounter: Secondary | ICD-10-CM | POA: Diagnosis not present

## 2013-04-18 DIAGNOSIS — S0990XA Unspecified injury of head, initial encounter: Secondary | ICD-10-CM | POA: Diagnosis present

## 2013-04-18 DIAGNOSIS — IMO0002 Reserved for concepts with insufficient information to code with codable children: Secondary | ICD-10-CM | POA: Insufficient documentation

## 2013-04-18 DIAGNOSIS — M542 Cervicalgia: Secondary | ICD-10-CM | POA: Diagnosis not present

## 2013-04-18 DIAGNOSIS — Y9389 Activity, other specified: Secondary | ICD-10-CM | POA: Diagnosis not present

## 2013-04-18 DIAGNOSIS — E7800 Pure hypercholesterolemia, unspecified: Secondary | ICD-10-CM | POA: Insufficient documentation

## 2013-04-18 DIAGNOSIS — F0390 Unspecified dementia without behavioral disturbance: Secondary | ICD-10-CM | POA: Diagnosis not present

## 2013-04-18 DIAGNOSIS — G609 Hereditary and idiopathic neuropathy, unspecified: Secondary | ICD-10-CM | POA: Diagnosis not present

## 2013-04-18 DIAGNOSIS — Z862 Personal history of diseases of the blood and blood-forming organs and certain disorders involving the immune mechanism: Secondary | ICD-10-CM | POA: Diagnosis not present

## 2013-04-18 DIAGNOSIS — Y9210 Unspecified residential institution as the place of occurrence of the external cause: Secondary | ICD-10-CM | POA: Diagnosis not present

## 2013-04-18 DIAGNOSIS — R32 Unspecified urinary incontinence: Secondary | ICD-10-CM | POA: Diagnosis not present

## 2013-04-18 DIAGNOSIS — G2581 Restless legs syndrome: Secondary | ICD-10-CM | POA: Diagnosis not present

## 2013-04-18 DIAGNOSIS — Z791 Long term (current) use of non-steroidal anti-inflammatories (NSAID): Secondary | ICD-10-CM | POA: Diagnosis not present

## 2013-04-18 LAB — URINALYSIS, ROUTINE W REFLEX MICROSCOPIC
Ketones, ur: NEGATIVE mg/dL
Nitrite: NEGATIVE
Protein, ur: NEGATIVE mg/dL
pH: 6 (ref 5.0–8.0)

## 2013-04-18 LAB — POCT I-STAT, CHEM 8
Calcium, Ion: 1.12 mmol/L — ABNORMAL LOW (ref 1.13–1.30)
Chloride: 106 mEq/L (ref 96–112)
HCT: 26 % — ABNORMAL LOW (ref 36.0–46.0)
TCO2: 24 mmol/L (ref 0–100)

## 2013-04-18 LAB — URINE MICROSCOPIC-ADD ON

## 2013-04-18 MED ORDER — SODIUM CHLORIDE 0.9 % IV BOLUS (SEPSIS)
500.0000 mL | Freq: Once | INTRAVENOUS | Status: AC
  Administered 2013-04-18: 500 mL via INTRAVENOUS

## 2013-04-18 NOTE — ED Notes (Signed)
Per EMS pt from nursing home and had un witnessed fall in her room on carpeted floor and pt reports hittig head. C/o of head and neck pain and back pain. Pt has bruised lips from a fall from yesterday. Pt was in room unattended for less than 5 minutes per staff. No other complaints. No deformities noted.

## 2013-04-18 NOTE — ED Notes (Signed)
Pt dressed, PTAR called 

## 2013-04-18 NOTE — ED Provider Notes (Signed)
History     CSN: [id number]  Arrival date & time 04/18/13  1444   First MD Initiated Contact with Patient 04/18/13 1508      No chief complaint on file.   (Consider location/radiation/quality/duration/timing/severity/associated sxs/prior treatment) HPI A LEVEL 5 CAVEAT PERTAINS DUE TO DEMENTIA Pt presents from nursing facility c/o neck pain and hitting her head after fall.  Per staff at facility and EMS she was unattended for 5 minutes and fell to the ground.  Hit head on carpeted floor.  No other complaints.  Pt was awake and alert per staff immediately after fall.  She does not take blood thinners.   Past Medical History  Diagnosis Date  . Dementia   . Anemia   . Hypercholesteremia   . Peripheral neuropathy   . GERD (gastroesophageal reflux disease)   . Restless leg syndrome   . Diverticular disease   . Incontinence     History reviewed. No pertinent past surgical history.  No family history on file.  History  Substance Use Topics  . Smoking status: Never Smoker   . Smokeless tobacco: Not on file  . Alcohol Use: No    OB History   Grav Para Term Preterm Abortions TAB SAB Ect Mult Living                  Review of Systems UNABLE TO OBTAIN ROS DUE TO LEVEL 5 CAVEAT  Allergies  Review of patient's allergies indicates no known allergies.  Home Medications   Current Outpatient Rx  Name  Route  Sig  Dispense  Refill  . acetaminophen (TYLENOL) 500 MG tablet   Oral   Take 1,000 mg by mouth every 8 (eight) hours. Scheduled 0800, 1400, 2000.         . beclomethasone (QVAR) 80 MCG/ACT inhaler   Inhalation   Inhale 2 puffs into the lungs 2 (two) times daily.         . bimatoprost (LUMIGAN) 0.01 % SOLN   Both Eyes   Place 1 drop into both eyes at bedtime.         . brimonidine-timolol (COMBIGAN) 0.2-0.5 % ophthalmic solution   Both Eyes   Place 1 drop into both eyes every 12 (twelve) hours.         . carbamide peroxide (DEBROX) 6.5 % otic solution   Both Ears   Place 3 drops into both ears every 30 (thirty) days. On the first day of each month.         . fish oil-omega-3 fatty acids 1000 MG capsule   Oral   Take 1 g by mouth 2 (two) times daily.         . furosemide (LASIX) 20 MG tablet   Oral   Take 20 mg by mouth daily.         Marland Kitchen gabapentin (NEURONTIN) 100 MG capsule   Oral   Take 100 mg by mouth daily.         Marland Kitchen guaifenesin (ROBITUSSIN) 100 MG/5ML syrup   Oral   Take 200 mg by mouth every 4 (four) hours as needed for cough.         . Hypromellose (NATURAL BALANCE TEARS) 0.4 % SOLN   Both Eyes   Place 1 drop into both eyes 4 (four) times daily.         . iron polysaccharides (NIFEREX) 150 MG capsule   Oral   Take 150 mg by mouth daily.         Marland Kitchen  levothyroxine (SYNTHROID, LEVOTHROID) 50 MCG tablet   Oral   Take 50 mcg by mouth daily.         Marland Kitchen LORazepam (ATIVAN) 0.5 MG tablet   Oral   Take 0.5 mg by mouth every 8 (eight) hours as needed for anxiety (severe agitation).         . miconazole (ZEASORB-AF) 2 % powder   Topical   Apply 1 application topically 2 (two) times daily.         Marland Kitchen nystatin (MYCOSTATIN/NYSTOP) 100000 UNIT/GM POWD   Topical   Apply topically. To affected areas (unspecified).         Marland Kitchen olopatadine (PATANOL) 0.1 % ophthalmic solution   Both Eyes   Place 1 drop into both eyes 2 (two) times daily.         Marland Kitchen omeprazole (PRILOSEC) 20 MG capsule   Oral   Take 20 mg by mouth daily.         . polyethylene glycol (MIRALAX / GLYCOLAX) packet   Oral   Take 17 g by mouth daily.         . risperiDONE (RISPERDAL) 0.25 MG tablet   Oral   Take 0.25 mg by mouth 2 (two) times daily.         . Sodium Fluoride (PREVIDENT 5000 DRY MOUTH) 1.1 % PSTE   dental   Place 1 application onto teeth 2 (two) times daily.         . vitamin B-12 (CYANOCOBALAMIN) 1000 MCG tablet   Oral   Take 1,000 mcg by mouth daily.           BP 111/59  Pulse 71  Temp(Src) 97.7 F (36.5  C) (Oral)  Resp 18  SpO2 100% Vitals reivewed Physical Exam Physical Examination: General appearance - alert, well appearing, and in no distress Mental status - alert, oriented to person not to place or time Head- NCAT Eyes - pupils equal and reactive, extraocular eye movements intact Mouth - mucous membranes moist, pharynx normal without lesions, bruising of left upper lip Neck - c-collar in place, mild midline cspine tenderness Chest - clear to auscultation, no wheezes, rales or rhonchi, symmetric air entry Heart - normal rate, regular rhythm, normal S1, S2, no murmurs, rubs, clicks or gallops Abdomen - soft, nontender, nondistended, no masses or organomegaly Neurological - alert, oriented x 1 only, strength 5/5 in extremities x 4, sensation intact Musculoskeletal - no joint tenderness, deformity or swelling Extremities - peripheral pulses normal, no pedal edema, no clubbing or cyanosis Skin - normal coloration and turgor, no rashes  ED Course  Procedures (including critical care time)   Date: 04/18/2013  Rate: 69  Rhythm: sinus rhythm with first degree AV block  QRS Axis: normal  Intervals: normal  ST/T Wave abnormalities: normal  Conduction Disutrbances:prolonged PR  Narrative Interpretation:   Old EKG Reviewed: unchanged   Labs Reviewed  URINALYSIS, ROUTINE W REFLEX MICROSCOPIC - Abnormal; Notable for the following:    Leukocytes, UA MODERATE (*)    All other components within normal limits  POCT I-STAT, CHEM 8 - Abnormal; Notable for the following:    Glucose, Bld 101 (*)    Calcium, Ion 1.12 (*)    Hemoglobin 8.8 (*)    HCT 26.0 (*)    All other components within normal limits  URINE CULTURE  URINE MICROSCOPIC-ADD ON   Ct Head Wo Contrast  04/18/2013  *RADIOLOGY REPORT*  Clinical Data:  Post fall, now with head and neck  pain  CT HEAD WITHOUT CONTRAST CT CERVICAL SPINE WITHOUT CONTRAST  Technique:  Multidetector CT imaging of the head and cervical spine was  performed following the standard protocol without intravenous contrast.  Multiplanar CT image reconstructions of the cervical spine were also generated.  Comparison:  Head CT - 05/23/2012; head and neck CT - 10/11/2010  CT HEAD  Findings: Redemonstrated is mild atrophy with mild prominence of the bifrontal extra-axial spaces.  Redemonstrated periventricular hypodensities compatible with microvascular ischemic disease. Grossly unchanged bilateral basal ganglial calcifications.  Given background parenchymal abnormalities, there is no CT evidence of acute large territory infarct.  No intraparenchymal or extra-axial mass or hemorrhage.  Unchanged size and configuration of the ventricles and basilar cisterns.  No midline shift.  Limited visualization of the paranasal sinuses and mastoid air cells are normal.  Post bilateral cataract surgery.  Regional soft tissues are normal.  No displaced calvarial fracture.  IMPRESSION: Stable findings of atrophy and microvascular ischemic disease without acute intracranial process.  CT CERVICAL SPINE  Findings:  C1 to the inferior endplate of T2 is imaged.  Normal alignment of the cervical spine.  No anterolisthesis or retrolisthesis.  The bilateral facets are normally aligned.  Stable sequela of remote minimally displaced fracture of the tip of the dens.  The dens is normally positioned between the lateral masses of C1.  There is mild to moderate degenerative changes of the atlantodental articulation, unchanged.  Normal atlantoaxial articulation.  No acute displaced cervical spine fracture.  Cervical vertebral body heights are preserved.  Prevertebral soft tissues are normal.  There is mild to moderate multilevel cervical spine in DDD, worst at C6 - C7 and to a lesser extent, C5 - C6, with disc space height loss, endplate irregularity, sclerosis and small posterior disc osteophyte complexes at these levels.  There is moderate multilevel bilateral facet degenerative change throughout  primarily the superior aspect of the cervical spine.  Limited visualization of the lung apices suggests biapical pleural parenchymal scarring.  Vascular calcifications within the bilateral carotid bulbs.  Normal noncontrast appearance of the thyroid gland.  IMPRESSION: 1.  No acute fracture or static subluxation of the cervical spine. 2.   Old/remote minimally displaced fracture of the tip of the dens, grossly unchanged since the 08/03/2010 examination.  3.  Mild to moderate multilevel cervical spine DDD, worse at C6 - C7 and to a lesser extent, C5 - C6.   Original Report Authenticated By: Tacey Ruiz, MD    Ct Cervical Spine Wo Contrast  04/18/2013  *RADIOLOGY REPORT*  Clinical Data:  Post fall, now with head and neck pain  CT HEAD WITHOUT CONTRAST CT CERVICAL SPINE WITHOUT CONTRAST  Technique:  Multidetector CT imaging of the head and cervical spine was performed following the standard protocol without intravenous contrast.  Multiplanar CT image reconstructions of the cervical spine were also generated.  Comparison:  Head CT - 05/23/2012; head and neck CT - 10/11/2010  CT HEAD  Findings: Redemonstrated is mild atrophy with mild prominence of the bifrontal extra-axial spaces.  Redemonstrated periventricular hypodensities compatible with microvascular ischemic disease. Grossly unchanged bilateral basal ganglial calcifications.  Given background parenchymal abnormalities, there is no CT evidence of acute large territory infarct.  No intraparenchymal or extra-axial mass or hemorrhage.  Unchanged size and configuration of the ventricles and basilar cisterns.  No midline shift.  Limited visualization of the paranasal sinuses and mastoid air cells are normal.  Post bilateral cataract surgery.  Regional soft tissues are normal.  No  displaced calvarial fracture.  IMPRESSION: Stable findings of atrophy and microvascular ischemic disease without acute intracranial process.  CT CERVICAL SPINE  Findings:  C1 to the inferior  endplate of T2 is imaged.  Normal alignment of the cervical spine.  No anterolisthesis or retrolisthesis.  The bilateral facets are normally aligned.  Stable sequela of remote minimally displaced fracture of the tip of the dens.  The dens is normally positioned between the lateral masses of C1.  There is mild to moderate degenerative changes of the atlantodental articulation, unchanged.  Normal atlantoaxial articulation.  No acute displaced cervical spine fracture.  Cervical vertebral body heights are preserved.  Prevertebral soft tissues are normal.  There is mild to moderate multilevel cervical spine in DDD, worst at C6 - C7 and to a lesser extent, C5 - C6, with disc space height loss, endplate irregularity, sclerosis and small posterior disc osteophyte complexes at these levels.  There is moderate multilevel bilateral facet degenerative change throughout primarily the superior aspect of the cervical spine.  Limited visualization of the lung apices suggests biapical pleural parenchymal scarring.  Vascular calcifications within the bilateral carotid bulbs.  Normal noncontrast appearance of the thyroid gland.  IMPRESSION: 1.  No acute fracture or static subluxation of the cervical spine. 2.   Old/remote minimally displaced fracture of the tip of the dens, grossly unchanged since the 08/03/2010 examination.  3.  Mild to moderate multilevel cervical spine DDD, worse at C6 - C7 and to a lesser extent, C5 - C6.   Original Report Authenticated By: Tacey Ruiz, MD      1. Fall, initial encounter   2. Minor head injury, initial encounter   3. Neck pain       MDM  Pt presenting with c/o unwitnessed fall with neck pain.  CT head with chronic changes, also ct neck with chronic degeneratlive changes- nothing acute found.  Pt found be hypotensive initially- she is awake and talkative, does not appear toxic or ill.  Rechecks of BP are normal.  EKG and labs reassuring, urinalysis shows WBCs, but no bacteria- urine  culture sent.  Discharged with strict return precautions.  Pt agreeable with plan.        Ethelda Chick, MD 04/18/13 [phone]

## 2013-04-19 LAB — URINE CULTURE

## 2013-04-30 ENCOUNTER — Emergency Department (HOSPITAL_COMMUNITY): Payer: Medicare Other

## 2013-04-30 ENCOUNTER — Emergency Department (HOSPITAL_COMMUNITY)
Admission: EM | Admit: 2013-04-30 | Discharge: 2013-04-30 | Disposition: A | Discharge status: Skilled Nursing Facility | Payer: Medicare Other | Attending: Emergency Medicine | Admitting: Emergency Medicine

## 2013-04-30 ENCOUNTER — Encounter (HOSPITAL_COMMUNITY): Payer: Self-pay | Admitting: Family Medicine

## 2013-04-30 DIAGNOSIS — E039 Hypothyroidism, unspecified: Secondary | ICD-10-CM | POA: Insufficient documentation

## 2013-04-30 DIAGNOSIS — S32509A Unspecified fracture of unspecified pubis, initial encounter for closed fracture: Secondary | ICD-10-CM | POA: Insufficient documentation

## 2013-04-30 DIAGNOSIS — Z862 Personal history of diseases of the blood and blood-forming organs and certain disorders involving the immune mechanism: Secondary | ICD-10-CM | POA: Insufficient documentation

## 2013-04-30 DIAGNOSIS — E7800 Pure hypercholesterolemia, unspecified: Secondary | ICD-10-CM | POA: Insufficient documentation

## 2013-04-30 DIAGNOSIS — R296 Repeated falls: Secondary | ICD-10-CM | POA: Insufficient documentation

## 2013-04-30 DIAGNOSIS — Y939 Activity, unspecified: Secondary | ICD-10-CM | POA: Insufficient documentation

## 2013-04-30 DIAGNOSIS — Y9210 Unspecified residential institution as the place of occurrence of the external cause: Secondary | ICD-10-CM | POA: Insufficient documentation

## 2013-04-30 DIAGNOSIS — K219 Gastro-esophageal reflux disease without esophagitis: Secondary | ICD-10-CM | POA: Insufficient documentation

## 2013-04-30 DIAGNOSIS — Z79899 Other long term (current) drug therapy: Secondary | ICD-10-CM | POA: Insufficient documentation

## 2013-04-30 DIAGNOSIS — S32512A Fracture of superior rim of left pubis, initial encounter for closed fracture: Secondary | ICD-10-CM

## 2013-04-30 DIAGNOSIS — Z8719 Personal history of other diseases of the digestive system: Secondary | ICD-10-CM | POA: Insufficient documentation

## 2013-04-30 DIAGNOSIS — F0390 Unspecified dementia without behavioral disturbance: Secondary | ICD-10-CM | POA: Insufficient documentation

## 2013-04-30 HISTORY — DX: Disorder of thyroid, unspecified: E07.9

## 2013-04-30 MED ORDER — TRAMADOL HCL 50 MG PO TABS: 50.0000 mg | ORAL_TABLET | Freq: Three times a day (TID) | ORAL | Status: DC | PRN

## 2013-04-30 NOTE — ED Notes (Signed)
Patient transported to CT 

## 2013-04-30 NOTE — ED Notes (Signed)
Patient transported to X-ray 

## 2013-04-30 NOTE — ED Notes (Signed)
Returned from xray

## 2013-04-30 NOTE — ED Provider Notes (Signed)
History     CSN: [id number]  Arrival date & time 04/30/13  0226   First MD Initiated Contact with Patient 04/30/13 0303      Chief Complaint  Patient presents with  . Fall    (Consider location/radiation/quality/duration/timing/severity/associated sxs/prior treatment) HPI Fall at nursing home, h/o dementia, does not recall details event tonight, BIB EMS who report history per nursing facility - level 5 caveat applies. PT c/o mild HA, R shoulder discomfort while ranging it and some pelvic pain. No hip pain. No neck pain, no other reported injury Past Medical History  Diagnosis Date  . Dementia   . Anemia   . Hypercholesteremia   . Peripheral neuropathy   . GERD (gastroesophageal reflux disease)   . Restless leg syndrome   . Diverticular disease   . Incontinence   . Thyroid disease     History reviewed. No pertinent past surgical history.  No family history on file.  History  Substance Use Topics  . Smoking status: Never Smoker   . Smokeless tobacco: Not on file  . Alcohol Use: No    OB History   Grav Para Term Preterm Abortions TAB SAB Ect Mult Living                  Review of Systems  Unable to perform ROS level 5 caveat   Allergies  Review of patient's allergies indicates no known allergies.  Home Medications   Current Outpatient Rx  Name  Route  Sig  Dispense  Refill  . acetaminophen (TYLENOL) 500 MG tablet   Oral   Take 1,000 mg by mouth every 8 (eight) hours. Scheduled 0800, 1400, 2000.         . beclomethasone (QVAR) 80 MCG/ACT inhaler   Inhalation   Inhale 2 puffs into the lungs 2 (two) times daily.         . bimatoprost (LUMIGAN) 0.01 % SOLN   Both Eyes   Place 1 drop into both eyes at bedtime.         . brimonidine-timolol (COMBIGAN) 0.2-0.5 % ophthalmic solution   Both Eyes   Place 1 drop into both eyes every 12 (twelve) hours.         . carbamide peroxide (DEBROX) 6.5 % otic solution   Both Ears   Place 3 drops into both  ears every 30 (thirty) days. On the first day of each month.         . fish oil-omega-3 fatty acids 1000 MG capsule   Oral   Take 1 g by mouth 2 (two) times daily.         . furosemide (LASIX) 20 MG tablet   Oral   Take 20 mg by mouth every morning.          . gabapentin (NEURONTIN) 100 MG capsule   Oral   Take 100 mg by mouth every morning.          . iron polysaccharides (NIFEREX) 150 MG capsule   Oral   Take 150 mg by mouth every morning.          Marland Kitchen levothyroxine (SYNTHROID, LEVOTHROID) 50 MCG tablet   Oral   Take 50 mcg by mouth every morning.          Marland Kitchen LORazepam (ATIVAN) 0.5 MG tablet   Oral   Take 0.5 mg by mouth every 8 (eight) hours as needed for anxiety (severe agitation).         . nystatin (MYCOSTATIN/NYSTOP)  100000 UNIT/GM POWD   Topical   Apply 0.5 g topically 2 (two) times daily. To affected areas under breasts         . olopatadine (PATANOL) 0.1 % ophthalmic solution   Both Eyes   Place 1 drop into both eyes 2 (two) times daily.         Marland Kitchen omeprazole (PRILOSEC) 20 MG capsule   Oral   Take 20 mg by mouth every morning.          . polyethylene glycol (MIRALAX / GLYCOLAX) packet   Oral   Take 17 g by mouth every morning.          . risperiDONE (RISPERDAL) 0.25 MG tablet   Oral   Take 0.25 mg by mouth 2 (two) times daily.         . Sodium Fluoride (PREVIDENT 5000 DRY MOUTH) 1.1 % PSTE   dental   Place 1 application onto teeth 2 (two) times daily.         . vitamin B-12 (CYANOCOBALAMIN) 1000 MCG tablet   Oral   Take 1,000 mcg by mouth every morning.            BP 110/60  Pulse 72  Temp(Src) 97.7 F (36.5 C) (Oral)  Resp 18  SpO2 99%  Physical Exam  Constitutional: She is oriented to person, place, and time. She appears well-developed and well-nourished.  HENT:  Head: Normocephalic and atraumatic.  Mouth/Throat: No oropharyngeal exudate.  Eyes: EOM are normal. Pupils are equal, round, and reactive to light.   Neck: Normal range of motion. Neck supple.  c spine nontender  Cardiovascular: Normal rate, regular rhythm and intact distal pulses.   Pulmonary/Chest: Effort normal and breath sounds normal. No respiratory distress. She exhibits no tenderness.  Abdominal: Soft. There is no tenderness.  Musculoskeletal: Normal range of motion. She exhibits no edema and no tenderness.  Pelvis stable, no shortening or rotation of LEs. RUE localized pain to anterior shoulder no obvious deformity. Distal N/V intact x 4  Neurological: She is alert and oriented to person, place, and time.  Skin: Skin is warm and dry.    ED Course  Procedures (including critical care time)  Dg Pelvis 1-2 Views  04/30/2013   *RADIOLOGY REPORT*  Clinical Data: Fall.  Confusion.  PELVIS - 1-2 VIEW  Comparison: None.  Findings: Prominent osteoarthritis of the hips noted with loss of articular space and associated spurring.  Mild subcortical sclerosis noted.  Lower lumbar spondylosis noted.  There is slight irregularity of the left superior pubic ramus which could conceivably represent a nondisplaced fracture, but no definite inferior ramus fractures noted.  IMPRESSION:  1.  Mild irregularity, left superior pubic ramus, could possibly represent a fracture but may reflect artifact due to rotation. This could be further assessed with CT scan of the pelvis. 2.  Prominent osteoarthritis of the hips.   Original Report Authenticated By: Gaylyn Rong, M.D.   Dg Shoulder Right  04/30/2013   *RADIOLOGY REPORT*  Clinical Data: Fall.  Confusion.  Dementia.  RIGHT SHOULDER - 2+ VIEW  Comparison: 05/05/2012  Findings: Right proximal humeral deformity with evidence old fracture and remote flattening of the humeral head.  Subcortical glenohumeral sclerosis.  Overall configuration appears similar to prior chest radiograph from last year. Suspected loose fragments in the glenohumeral joint.  Glenohumeral joint not seen on the axillary view.  IMPRESSION:   1.  Chronic deformity, right humeral head and glenoid, with prominent spurring, flattening of the  humeral head, and subcortical sclerosis.  This seems to have been present on prior radiographs from last year.  Probable fragments in the glenohumeral joint.   Original Report Authenticated By: Gaylyn Rong, M.D.   Ct Head Wo Contrast  04/30/2013   *RADIOLOGY REPORT*  Clinical Data:  Fall.  Head injury.  Dementia.  CT HEAD WITHOUT CONTRAST CT CERVICAL SPINE WITHOUT CONTRAST  Technique:  Multidetector CT imaging of the head and cervical spine was performed following the standard protocol without intravenous contrast.  Multiplanar CT image reconstructions of the cervical spine were also generated.  Comparison:  04/18/2013  CT HEAD  Findings: The brain stem, cerebellum, cerebral peduncles, thalami, basal ganglia, basilar cisterns, and ventricular system appear unremarkable.  Periventricular and corona radiata white matter hypodensities are most compatible with chronic ischemic microvascular white matter disease.  No intracranial hemorrhage, mass lesion, or acute infarction is identified.  IMPRESSION:  1.  No acute intracranial findings.  Chronic microvascular white matter disease.  CT CERVICAL SPINE  Findings: Stable appearance of remote chronic fracture of the superior tip of the odontoid, not significantly affecting the anterior C1-2 articulation.  Degenerative loss of articular space of the anterior C1-2 articulation noted.  Stable multilevel facet and uncinate spurring resulting and osseous foraminal narrowing most notably on the left and C4-5, C5-6, and C6- 7; and on the right at C3-4 and C5-6.  Stable subtle anterior wedging at T1.  Pannus formation posterior to the odontoid, chronic.  Chronic degenerative anterolisthesis at C6-7, C7-T1, and T1-2.  IMPRESSION:  1.  Stable cervical spondylosis.  No acute fracture or acute subluxation.   Original Report Authenticated By: Gaylyn Rong, M.D.   Ct Head Wo  Contrast  04/18/2013   *RADIOLOGY REPORT*  Clinical Data:  Post fall, now with head and neck pain  CT HEAD WITHOUT CONTRAST CT CERVICAL SPINE WITHOUT CONTRAST  Technique:  Multidetector CT imaging of the head and cervical spine was performed following the standard protocol without intravenous contrast.  Multiplanar CT image reconstructions of the cervical spine were also generated.  Comparison:  Head CT - 05/23/2012; head and neck CT - 10/11/2010  CT HEAD  Findings: Redemonstrated is mild atrophy with mild prominence of the bifrontal extra-axial spaces.  Redemonstrated periventricular hypodensities compatible with microvascular ischemic disease. Grossly unchanged bilateral basal ganglial calcifications.  Given background parenchymal abnormalities, there is no CT evidence of acute large territory infarct.  No intraparenchymal or extra-axial mass or hemorrhage.  Unchanged size and configuration of the ventricles and basilar cisterns.  No midline shift.  Limited visualization of the paranasal sinuses and mastoid air cells are normal.  Post bilateral cataract surgery.  Regional soft tissues are normal.  No displaced calvarial fracture.  IMPRESSION: Stable findings of atrophy and microvascular ischemic disease without acute intracranial process.  CT CERVICAL SPINE  Findings:  C1 to the inferior endplate of T2 is imaged.  Normal alignment of the cervical spine.  No anterolisthesis or retrolisthesis.  The bilateral facets are normally aligned.  Stable sequela of remote minimally displaced fracture of the tip of the dens.  The dens is normally positioned between the lateral masses of C1.  There is mild to moderate degenerative changes of the atlantodental articulation, unchanged.  Normal atlantoaxial articulation.  No acute displaced cervical spine fracture.  Cervical vertebral body heights are preserved.  Prevertebral soft tissues are normal.  There is mild to moderate multilevel cervical spine in DDD, worst at C6 - C7 and  to a lesser extent, C5 -  C6, with disc space height loss, endplate irregularity, sclerosis and small posterior disc osteophyte complexes at these levels.  There is moderate multilevel bilateral facet degenerative change throughout primarily the superior aspect of the cervical spine.  Limited visualization of the lung apices suggests biapical pleural parenchymal scarring.  Vascular calcifications within the bilateral carotid bulbs.  Normal noncontrast appearance of the thyroid gland.  IMPRESSION: 1.  No acute fracture or static subluxation of the cervical spine. 2.   Old/remote minimally displaced fracture of the tip of the dens, grossly unchanged since the 08/03/2010 examination.  3.  Mild to moderate multilevel cervical spine DDD, worse at C6 - C7 and to a lesser extent, C5 - C6.   Original Report Authenticated By: Tacey Ruiz, MD   Ct Cervical Spine Wo Contrast  04/30/2013   *RADIOLOGY REPORT*  Clinical Data:  Fall.  Head injury.  Dementia.  CT HEAD WITHOUT CONTRAST CT CERVICAL SPINE WITHOUT CONTRAST  Technique:  Multidetector CT imaging of the head and cervical spine was performed following the standard protocol without intravenous contrast.  Multiplanar CT image reconstructions of the cervical spine were also generated.  Comparison:  04/18/2013  CT HEAD  Findings: The brain stem, cerebellum, cerebral peduncles, thalami, basal ganglia, basilar cisterns, and ventricular system appear unremarkable.  Periventricular and corona radiata white matter hypodensities are most compatible with chronic ischemic microvascular white matter disease.  No intracranial hemorrhage, mass lesion, or acute infarction is identified.  IMPRESSION:  1.  No acute intracranial findings.  Chronic microvascular white matter disease.  CT CERVICAL SPINE  Findings: Stable appearance of remote chronic fracture of the superior tip of the odontoid, not significantly affecting the anterior C1-2 articulation.  Degenerative loss of articular space  of the anterior C1-2 articulation noted.  Stable multilevel facet and uncinate spurring resulting and osseous foraminal narrowing most notably on the left and C4-5, C5-6, and C6- 7; and on the right at C3-4 and C5-6.  Stable subtle anterior wedging at T1.  Pannus formation posterior to the odontoid, chronic.  Chronic degenerative anterolisthesis at C6-7, C7-T1, and T1-2.  IMPRESSION:  1.  Stable cervical spondylosis.  No acute fracture or acute subluxation.   Original Report Authenticated By: Gaylyn Rong, M.D.   Ct Cervical Spine Wo Contrast  04/18/2013   *RADIOLOGY REPORT*  Clinical Data:  Post fall, now with head and neck pain  CT HEAD WITHOUT CONTRAST CT CERVICAL SPINE WITHOUT CONTRAST  Technique:  Multidetector CT imaging of the head and cervical spine was performed following the standard protocol without intravenous contrast.  Multiplanar CT image reconstructions of the cervical spine were also generated.  Comparison:  Head CT - 05/23/2012; head and neck CT - 10/11/2010  CT HEAD  Findings: Redemonstrated is mild atrophy with mild prominence of the bifrontal extra-axial spaces.  Redemonstrated periventricular hypodensities compatible with microvascular ischemic disease. Grossly unchanged bilateral basal ganglial calcifications.  Given background parenchymal abnormalities, there is no CT evidence of acute large territory infarct.  No intraparenchymal or extra-axial mass or hemorrhage.  Unchanged size and configuration of the ventricles and basilar cisterns.  No midline shift.  Limited visualization of the paranasal sinuses and mastoid air cells are normal.  Post bilateral cataract surgery.  Regional soft tissues are normal.  No displaced calvarial fracture.  IMPRESSION: Stable findings of atrophy and microvascular ischemic disease without acute intracranial process.  CT CERVICAL SPINE  Findings:  C1 to the inferior endplate of T2 is imaged.  Normal alignment of the cervical spine.  No anterolisthesis or  retrolisthesis.  The bilateral facets are normally aligned.  Stable sequela of remote minimally displaced fracture of the tip of the dens.  The dens is normally positioned between the lateral masses of C1.  There is mild to moderate degenerative changes of the atlantodental articulation, unchanged.  Normal atlantoaxial articulation.  No acute displaced cervical spine fracture.  Cervical vertebral body heights are preserved.  Prevertebral soft tissues are normal.  There is mild to moderate multilevel cervical spine in DDD, worst at C6 - C7 and to a lesser extent, C5 - C6, with disc space height loss, endplate irregularity, sclerosis and small posterior disc osteophyte complexes at these levels.  There is moderate multilevel bilateral facet degenerative change throughout primarily the superior aspect of the cervical spine.  Limited visualization of the lung apices suggests biapical pleural parenchymal scarring.  Vascular calcifications within the bilateral carotid bulbs.  Normal noncontrast appearance of the thyroid gland.  IMPRESSION: 1.  No acute fracture or static subluxation of the cervical spine. 2.   Old/remote minimally displaced fracture of the tip of the dens, grossly unchanged since the 08/03/2010 examination.  3.  Mild to moderate multilevel cervical spine DDD, worse at C6 - C7 and to a lesser extent, C5 - C6.   Original Report Authenticated By: Tacey Ruiz, MD   Ct Pelvis Wo Contrast  04/30/2013   *RADIOLOGY REPORT*  Clinical Data: Fall.  Questionable pelvic fracture.  CT PELVIS WITHOUT CONTRAST  Technique:  Multidetector CT imaging of the pelvis was performed following the standard protocol without intravenous contrast.  Comparison: Radiographs from 04/30/2013  Findings: The cortical irregularity seen along the left superior pubic ramus is confirmed to represent a small linear nondisplaced cortical discontinuity compatible with fracture involving the superior ramus.  Well-defined inferior pubic  fracture is not well seen.  Urinary bladder is relatively empty and borderline thick-walled. Prominent rectal stool noted.  Prominent degenerative arthropathy of the hips noted with subcortical sclerosis, degenerative subcortical lesions, and spurring with loss of articular space.  Spondylosis and degenerative disc disease noted at L4-5 and L5-S1 with loss of disc height and vacuum disc phenomenon.  A well-defined sacral alar fracture is not observed.  Scattered colonic diverticula are present.  IMPRESSION:  1.  Minimal cortical discontinuity in the previous site of concern along the left superior pubic ramus, compatible with nondisplaced fracture.  Corresponding inferior pubic ramus and sacral fractures are not well seen. 2.  Lower lumbar spondylosis and degenerative disc disease. 3.  Large amount of stool in the rectal vault.   Original Report Authenticated By: Gaylyn Rong, M.D.      MDM  Fall with ND pelvic fracture as above  CTs, xrays, labs  VS/ nurisng notes reviewed        Sharon Nielsen, MD 04/30/13 [phone]

## 2013-04-30 NOTE — ED Notes (Addendum)
Went to bedside and found patient trying to remove C-collar and that patient had broken the clip-on pulse oximeter. Patient re-oriented to place and time.

## 2013-04-30 NOTE — ED Notes (Signed)
[facility] transported pt to [facility]; discharge papers, SBAR and face sheet given to [facility].  Report called to [facility] on night shift.

## 2013-04-30 NOTE — ED Notes (Signed)
[employer] contacted for transport back to [facility].

## 2013-04-30 NOTE — ED Notes (Signed)
Per EMS, patient from [facility] facility for evaluation of fall. Patient fell one hour prior to arrival. No reports of pain.

## 2013-05-31 ENCOUNTER — Encounter (HOSPITAL_COMMUNITY): Payer: Self-pay

## 2013-05-31 ENCOUNTER — Other Ambulatory Visit: Payer: Self-pay

## 2013-05-31 ENCOUNTER — Emergency Department (HOSPITAL_COMMUNITY): Payer: Medicare Other

## 2013-05-31 ENCOUNTER — Emergency Department (HOSPITAL_COMMUNITY)
Admission: EM | Admit: 2013-05-31 | Discharge: 2013-05-31 | Disposition: A | Discharge status: Skilled Nursing Facility | Payer: Medicare Other | Attending: Emergency Medicine | Admitting: Emergency Medicine

## 2013-05-31 DIAGNOSIS — IMO0001 Reserved for inherently not codable concepts without codable children: Secondary | ICD-10-CM | POA: Insufficient documentation

## 2013-05-31 DIAGNOSIS — IMO0002 Reserved for concepts with insufficient information to code with codable children: Secondary | ICD-10-CM | POA: Insufficient documentation

## 2013-05-31 DIAGNOSIS — F0390 Unspecified dementia without behavioral disturbance: Secondary | ICD-10-CM | POA: Insufficient documentation

## 2013-05-31 DIAGNOSIS — D649 Anemia, unspecified: Secondary | ICD-10-CM | POA: Insufficient documentation

## 2013-05-31 DIAGNOSIS — I6789 Other cerebrovascular disease: Secondary | ICD-10-CM | POA: Insufficient documentation

## 2013-05-31 DIAGNOSIS — I658 Occlusion and stenosis of other precerebral arteries: Secondary | ICD-10-CM | POA: Insufficient documentation

## 2013-05-31 DIAGNOSIS — Z79899 Other long term (current) drug therapy: Secondary | ICD-10-CM | POA: Insufficient documentation

## 2013-05-31 DIAGNOSIS — I517 Cardiomegaly: Secondary | ICD-10-CM | POA: Insufficient documentation

## 2013-05-31 DIAGNOSIS — M949 Disorder of cartilage, unspecified: Secondary | ICD-10-CM | POA: Insufficient documentation

## 2013-05-31 DIAGNOSIS — R4182 Altered mental status, unspecified: Secondary | ICD-10-CM | POA: Insufficient documentation

## 2013-05-31 DIAGNOSIS — I6529 Occlusion and stenosis of unspecified carotid artery: Secondary | ICD-10-CM | POA: Insufficient documentation

## 2013-05-31 DIAGNOSIS — G319 Degenerative disease of nervous system, unspecified: Secondary | ICD-10-CM | POA: Insufficient documentation

## 2013-05-31 DIAGNOSIS — M899 Disorder of bone, unspecified: Secondary | ICD-10-CM | POA: Insufficient documentation

## 2013-05-31 DIAGNOSIS — G609 Hereditary and idiopathic neuropathy, unspecified: Secondary | ICD-10-CM | POA: Insufficient documentation

## 2013-05-31 DIAGNOSIS — K219 Gastro-esophageal reflux disease without esophagitis: Secondary | ICD-10-CM | POA: Insufficient documentation

## 2013-05-31 DIAGNOSIS — M1710 Unilateral primary osteoarthritis, unspecified knee: Secondary | ICD-10-CM | POA: Insufficient documentation

## 2013-05-31 DIAGNOSIS — M47812 Spondylosis without myelopathy or radiculopathy, cervical region: Secondary | ICD-10-CM | POA: Insufficient documentation

## 2013-05-31 DIAGNOSIS — M51379 Other intervertebral disc degeneration, lumbosacral region without mention of lumbar back pain or lower extremity pain: Secondary | ICD-10-CM | POA: Insufficient documentation

## 2013-05-31 DIAGNOSIS — M25469 Effusion, unspecified knee: Secondary | ICD-10-CM | POA: Insufficient documentation

## 2013-05-31 DIAGNOSIS — N949 Unspecified condition associated with female genital organs and menstrual cycle: Secondary | ICD-10-CM | POA: Insufficient documentation

## 2013-05-31 DIAGNOSIS — E079 Disorder of thyroid, unspecified: Secondary | ICD-10-CM | POA: Insufficient documentation

## 2013-05-31 DIAGNOSIS — E7800 Pure hypercholesterolemia, unspecified: Secondary | ICD-10-CM | POA: Insufficient documentation

## 2013-05-31 DIAGNOSIS — M5137 Other intervertebral disc degeneration, lumbosacral region: Secondary | ICD-10-CM | POA: Insufficient documentation

## 2013-05-31 DIAGNOSIS — S329XXD Fracture of unspecified parts of lumbosacral spine and pelvis, subsequent encounter for fracture with routine healing: Secondary | ICD-10-CM

## 2013-05-31 DIAGNOSIS — W19XXXA Unspecified fall, initial encounter: Secondary | ICD-10-CM

## 2013-05-31 LAB — COMPREHENSIVE METABOLIC PANEL
AST: 11 U/L (ref 0–37)
Alkaline Phosphatase: 95 U/L (ref 39–117)
BUN: 16 mg/dL (ref 6–23)
CO2: 24 mEq/L (ref 19–32)
Chloride: 102 mEq/L (ref 96–112)
Creatinine, Ser: 0.44 mg/dL — ABNORMAL LOW (ref 0.50–1.10)
GFR calc non Af Amer: >90 mL/min (ref >90)
Total Bilirubin: 0.2 mg/dL — ABNORMAL LOW (ref 0.3–1.2)

## 2013-05-31 LAB — CBC WITH DIFFERENTIAL/PLATELET
Basophils Absolute: 0 10*3/uL (ref 0.0–0.1)
HCT: 28.7 % — ABNORMAL LOW (ref 36.0–46.0)
Hemoglobin: 9 g/dL — ABNORMAL LOW (ref 12.0–15.0)
Lymphocytes Relative: 31 % (ref 12–46)
Monocytes Absolute: 0.8 10*3/uL (ref 0.1–1.0)
Monocytes Relative: 8 % (ref 3–12)
Neutro Abs: 5.3 10*3/uL (ref 1.7–7.7)
RBC: 3.32 MIL/uL — ABNORMAL LOW (ref 3.87–5.11)
WBC: 9.2 10*3/uL (ref 4.0–10.5)

## 2013-05-31 NOTE — ED Provider Notes (Signed)
History     CSN: [id number]  Arrival date & time 05/31/13  1541   First MD Initiated Contact with Patient 05/31/13 1543      Chief Complaint  Patient presents with  . Fall    (Consider location/radiation/quality/duration/timing/severity/associated sxs/prior treatment) HPI Comments: Patient from nursing home with unwitnessed fall. She has dementia and is history. She is oriented to herself and to the hospital. She complains of pain in her right knee and her neck. No reported loss of consciousness. Denies any chest pain, head pain, back pain or abdominal pain. She is moving all extremities equally  The history is provided by the patient, the EMS personnel and the nursing home. The history is limited by the condition of the patient.    Past Medical History  Diagnosis Date  . Dementia   . Anemia   . Hypercholesteremia   . Peripheral neuropathy   . GERD (gastroesophageal reflux disease)   . Restless leg syndrome   . Diverticular disease   . Incontinence   . Thyroid disease     History reviewed. No pertinent past surgical history.  History reviewed. No pertinent family history.  History  Substance Use Topics  . Smoking status: Never Smoker   . Smokeless tobacco: Not on file  . Alcohol Use: No    OB History   Grav Para Term Preterm Abortions TAB SAB Ect Mult Living                  Review of Systems  Unable to perform ROS: Dementia    Allergies  Review of patient's allergies indicates no known allergies.  Home Medications   Current Outpatient Rx  Name  Route  Sig  Dispense  Refill  . acetaminophen (TYLENOL) 500 MG tablet   Oral   Take 1,000 mg by mouth every 8 (eight) hours. Scheduled 0800, 1400, 2000.         . beclomethasone (QVAR) 80 MCG/ACT inhaler   Inhalation   Inhale 2 puffs into the lungs 2 (two) times daily.         . bimatoprost (LUMIGAN) 0.01 % SOLN   Both Eyes   Place 1 drop into both eyes at bedtime.         . brimonidine-timolol  (COMBIGAN) 0.2-0.5 % ophthalmic solution   Both Eyes   Place 1 drop into both eyes every 12 (twelve) hours.         . carbamide peroxide (DEBROX) 6.5 % otic solution   Both Ears   Place 3 drops into both ears every 30 (thirty) days. On the first day of each month.         . fish oil-omega-3 fatty acids 1000 MG capsule   Oral   Take 1 g by mouth 2 (two) times daily.         . furosemide (LASIX) 20 MG tablet   Oral   Take 20 mg by mouth every morning.          . gabapentin (NEURONTIN) 100 MG capsule   Oral   Take 100 mg by mouth every morning.          Marland Kitchen guaifenesin (ROBITUSSIN) 100 MG/5ML syrup   Oral   Take 100 mg by mouth every 4 (four) hours as needed for cough.         . iron polysaccharides (NIFEREX) 150 MG capsule   Oral   Take 150 mg by mouth every morning.          Marland Kitchen  levothyroxine (SYNTHROID, LEVOTHROID) 50 MCG tablet   Oral   Take 50 mcg by mouth every morning.          . loperamide (IMODIUM) 2 MG capsule   Oral   Take 2 mg by mouth 4 (four) times daily as needed for diarrhea or loose stools.         . nystatin (MYCOSTATIN/NYSTOP) 100000 UNIT/GM POWD   Topical   Apply 0.5 g topically 2 (two) times daily. To affected areas under breasts         . olopatadine (PATANOL) 0.1 % ophthalmic solution   Both Eyes   Place 1 drop into both eyes 2 (two) times daily.         Marland Kitchen omeprazole (PRILOSEC) 20 MG capsule   Oral   Take 20 mg by mouth every morning.          . polyethylene glycol (MIRALAX / GLYCOLAX) packet   Oral   Take 17 g by mouth every morning.          Marland Kitchen PRESCRIPTION MEDICATION   Topical   Apply 1 mL topically every 8 (eight) hours as needed (for agitation). Lorazepam 1mg /ml GEL per MAR         . risperiDONE (RISPERDAL) 0.25 MG tablet   Oral   Take 0.25 mg by mouth 2 (two) times daily.         . Sodium Fluoride (PREVIDENT 5000 DRY MOUTH) 1.1 % PSTE   dental   Place 1 application onto teeth 2 (two) times daily.          . traMADol (ULTRAM) 50 MG tablet   Oral   Take 1 tablet (50 mg total) by mouth every 8 (eight) hours as needed for pain.   15 tablet   0   . vitamin B-12 (CYANOCOBALAMIN) 1000 MCG tablet   Oral   Take 1,000 mcg by mouth every morning.            BP 112/47  Pulse 71  Temp(Src) 98.4 F (36.9 C) (Oral)  Resp 11  SpO2 99%  Physical Exam  Constitutional: She appears well-developed and well-nourished. No distress.  HENT:  Head: Normocephalic and atraumatic.  Mouth/Throat: Oropharynx is clear and moist. No oropharyngeal exudate.  Eyes: Conjunctivae and EOM are normal. Pupils are equal, round, and reactive to light.  Neck: Normal range of motion. Neck supple.  Cardiovascular: Normal rate, regular rhythm and normal heart sounds.   No murmur heard. Pulmonary/Chest: Effort normal and breath sounds normal. No respiratory distress.  Abdominal: Soft. There is no tenderness. There is no rebound and no guarding.  Musculoskeletal: Normal range of motion. She exhibits tenderness. She exhibits no edema.  No stepoffs or tenderness to T or L spine. Old abrasion R knee, FROM with out difficulty. Intact DP and PT pulses Pelvis stable. No shortening of the lower extremities. No pain with log rolling  Neurological: She is alert. No cranial nerve deficit. She exhibits normal muscle tone. Coordination normal.  Skin: Skin is warm. No erythema.    ED Course  Procedures (including critical care time)  Labs Reviewed  CBC WITH DIFFERENTIAL - Abnormal; Notable for the following:    RBC 3.32 (*)    Hemoglobin 9.0 (*)    HCT 28.7 (*)    RDW 16.2 (*)    All other components within normal limits  COMPREHENSIVE METABOLIC PANEL - Abnormal; Notable for the following:    Creatinine, Ser 0.44 (*)    Albumin 3.1 (*)  Total Bilirubin 0.2 (*)    All other components within normal limits  TROPONIN I  URINALYSIS, ROUTINE W REFLEX MICROSCOPIC   Dg Chest 2 View  05/31/2013   *RADIOLOGY REPORT*   Clinical Data: Fall.  CHEST - 2 VIEW  Comparison: 05/23/2012.  Findings:  Mild cardiomegaly and vascular congestion.  Bibasilar atelectasis or scarring, similar to prior study.  No visible acute rib fracture.  Advanced degenerative changes in the shoulders.  No pneumothorax or pleural effusion.  IMPRESSION: Cardiomegaly.  Bibasilar atelectasis or scarring.  No acute findings.   Original Report Authenticated By: Charlett Nose, M.D.   Dg Pelvis 1-2 Views  05/31/2013   *RADIOLOGY REPORT*  Clinical Data: Recent injury with pain  PELVIS - 1-2 VIEW  Comparison: 04/30/2013  Findings: There are changes consistent with a known history of prior left superior pubic ramus fracture.  No acute fracture is seen.  Degenerative changes of the hip joints are noted.  No soft tissue abnormality is noted.  IMPRESSION: Changes consistent with the known left superior pubic ramus fracture.   Original Report Authenticated By: Alcide Clever, M.D.   Ct Head Wo Contrast  05/31/2013   *RADIOLOGY REPORT*  Clinical Data:  Altered mental status and right neck pain following a fall.  CT HEAD WITHOUT CONTRAST CT CERVICAL SPINE WITHOUT CONTRAST  Technique:  Multidetector CT imaging of the head and cervical spine was performed following the standard protocol without intravenous contrast.  Multiplanar CT image reconstructions of the cervical spine were also generated.  Comparison:  04/30/2013.  CT HEAD  Findings: Stable enlarged ventricles and subarachnoid spaces. Stable patchy white matter low density in both cerebral hemispheres.  No skull fracture, intracranial hemorrhage or paranasal sinus air-fluid levels.  No mass or CT evidence of acute infarction.  IMPRESSION:  1.  No acute abnormality. 2.  Stable atrophy and chronic small vessel white matter ischemic changes.  CT CERVICAL SPINE  Findings: Stable multilevel degenerative changes.  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  No prevertebral soft tissue swelling, no acute fractures or acute  subluxations.  Bilateral carotid artery calcifications.  IMPRESSION:  1.  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 2.  No new fracture or acute subluxation. 3.  Stable multilevel degenerative changes. 4.  Bilateral carotid artery atheromatous calcifications.   Original Report Authenticated By: Beckie Salts, M.D.   Ct Cervical Spine Wo Contrast  05/31/2013   *RADIOLOGY REPORT*  Clinical Data:  Altered mental status and right neck pain following a fall.  CT HEAD WITHOUT CONTRAST CT CERVICAL SPINE WITHOUT CONTRAST  Technique:  Multidetector CT imaging of the head and cervical spine was performed following the standard protocol without intravenous contrast.  Multiplanar CT image reconstructions of the cervical spine were also generated.  Comparison:  04/30/2013.  CT HEAD  Findings: Stable enlarged ventricles and subarachnoid spaces. Stable patchy white matter low density in both cerebral hemispheres.  No skull fracture, intracranial hemorrhage or paranasal sinus air-fluid levels.  No mass or CT evidence of acute infarction.  IMPRESSION:  1.  No acute abnormality. 2.  Stable atrophy and chronic small vessel white matter ischemic changes.  CT CERVICAL SPINE  Findings: Stable multilevel degenerative changes.  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  No prevertebral soft tissue swelling, no acute fractures or acute subluxations.  Bilateral carotid artery calcifications.  IMPRESSION:  1.  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 2.  No new fracture or acute subluxation. 3.  Stable multilevel degenerative changes. 4.  Bilateral carotid artery atheromatous calcifications.   Original Report Authenticated By: Beckie Salts, M.D.   Ct Pelvis Wo Contrast  05/31/2013   *RADIOLOGY REPORT*  Clinical Data: Fall.  CT PELVIS WITHOUT CONTRAST  Technique:  Multidetector CT imaging of the pelvis was performed following the standard protocol without intravenous contrast.  Comparison: 05/31/2013 and 04/30/2013   Findings: There appears to be callus formation involving the mildly displaced left superior pubic ramus fracture.  Small area of sclerosis representing a healing nondisplaced left inferior pubic ramus fracture.  A sacral fracture is not confidently identified. Degenerative changes in both sacroiliac joints.  Both hips are located.  There is a stable sclerosis involving the femoral heads probably related to degenerative disease.  No evidence for a femur fracture.  Disc disease L4-L5 and L5-S1.  Again noted is diffuse wall thickening of the urinary bladder. Large amount of stool in the distal colon and rectum.  IMPRESSION: Healing fractures of the left superior and inferior pubic rami.  No acute bony abnormality.  Chronic degenerative changes in both hips.  Chronic wall thickening of the bladder.  Large amount of stool in the distal colon.  Degenerative disc disease in the lower lumbar spine.   Original Report Authenticated By: Richarda Overlie, M.D.   Dg Knee Complete 4 Views Right  05/31/2013   *RADIOLOGY REPORT*  Clinical Data: Fall.  Knee pain.  RIGHT KNEE - COMPLETE 4+ VIEW  Comparison: None.  Findings: No evidence of fracture or dislocation.  Small knee joint effusion noted.  Tricompartmental osteoarthritis is seen with predominant involvement of the medial compartment.  Peripheral vascular calcification also noted.  IMPRESSION:  1.  No evidence of fracture or dislocation. 2.  Tricompartmental osteoarthritis and small knee joint effusion.   Original Report Authenticated By: Myles Rosenthal, M.D.     1. Fall, initial encounter   2. Pelvic fracture, with routine healing, subsequent encounter       MDM  Unwitnessed fall with neck and right knee pain. Dementia at baseline, unable to give a history. no anticoagulant use.  CT head and C-spine negative. Healing pelvic fractures seen on pelvic films. No new injury.  Hemoglobin stable. BP 112/47  Pulse 71  Temp(Src) 98.4 F (36.9 C) (Oral)  Resp 11  SpO2  99% EKG unchanged. Troponin negative. Stable for return to facility.   Date: 05/31/2013  Rate: 74  Rhythm: normal sinus rhythm  QRS Axis: normal  Intervals: PR prolonged  ST/T Wave abnormalities: normal  Conduction Disutrbances:first-degree A-V block   Narrative Interpretation:   Old EKG Reviewed: unchanged    Glynn Octave, MD 05/31/13 2317

## 2013-05-31 NOTE — ED Notes (Signed)
Pt three man log-rolled off LSB with EDP and 2 RNs. C-spine maintained.

## 2013-05-31 NOTE — ED Notes (Signed)
Pt from  Nursing home, c/o fall. Facility states it was witnessed, but ems received little information. Pt alert, not oriented. Initially non-verbal. Larey Seat on carpet, near end of bed. Has pain in right neck.

## 2013-10-28 ENCOUNTER — Encounter (HOSPITAL_COMMUNITY): Payer: Self-pay | Admitting: Emergency Medicine

## 2013-10-28 ENCOUNTER — Emergency Department (HOSPITAL_COMMUNITY): Payer: Medicare Other

## 2013-10-28 ENCOUNTER — Inpatient Hospital Stay (HOSPITAL_COMMUNITY)
Admission: EM | Admit: 2013-10-28 | Discharge: 2013-10-31 | DRG: 308 | Disposition: A | Discharge status: Hospice | Payer: Medicare Other | Attending: Internal Medicine | Admitting: Internal Medicine

## 2013-10-28 DIAGNOSIS — I509 Heart failure, unspecified: Secondary | ICD-10-CM | POA: Diagnosis present

## 2013-10-28 DIAGNOSIS — Z79899 Other long term (current) drug therapy: Secondary | ICD-10-CM

## 2013-10-28 DIAGNOSIS — I959 Hypotension, unspecified: Secondary | ICD-10-CM | POA: Diagnosis present

## 2013-10-28 DIAGNOSIS — Z515 Encounter for palliative care: Secondary | ICD-10-CM

## 2013-10-28 DIAGNOSIS — I5033 Acute on chronic diastolic (congestive) heart failure: Secondary | ICD-10-CM | POA: Diagnosis present

## 2013-10-28 DIAGNOSIS — E7800 Pure hypercholesterolemia, unspecified: Secondary | ICD-10-CM | POA: Diagnosis present

## 2013-10-28 DIAGNOSIS — I5031 Acute diastolic (congestive) heart failure: Secondary | ICD-10-CM | POA: Diagnosis present

## 2013-10-28 DIAGNOSIS — G609 Hereditary and idiopathic neuropathy, unspecified: Secondary | ICD-10-CM | POA: Diagnosis present

## 2013-10-28 DIAGNOSIS — G2581 Restless legs syndrome: Secondary | ICD-10-CM | POA: Diagnosis present

## 2013-10-28 DIAGNOSIS — I4891 Unspecified atrial fibrillation: Principal | ICD-10-CM | POA: Diagnosis present

## 2013-10-28 DIAGNOSIS — K219 Gastro-esophageal reflux disease without esophagitis: Secondary | ICD-10-CM | POA: Diagnosis present

## 2013-10-28 DIAGNOSIS — Z66 Do not resuscitate: Secondary | ICD-10-CM | POA: Diagnosis present

## 2013-10-28 DIAGNOSIS — D649 Anemia, unspecified: Secondary | ICD-10-CM | POA: Diagnosis present

## 2013-10-28 DIAGNOSIS — F0390 Unspecified dementia without behavioral disturbance: Secondary | ICD-10-CM | POA: Diagnosis present

## 2013-10-28 DIAGNOSIS — E039 Hypothyroidism, unspecified: Secondary | ICD-10-CM | POA: Diagnosis present

## 2013-10-28 LAB — POCT I-STAT TROPONIN I

## 2013-10-28 LAB — CBC WITH DIFFERENTIAL/PLATELET
Eosinophils Absolute: 0.1 10*3/uL (ref 0.0–0.7)
Eosinophils Relative: 1 % (ref 0–5)
HCT: 31.3 % — ABNORMAL LOW (ref 36.0–46.0)
Hemoglobin: 9.8 g/dL — ABNORMAL LOW (ref 12.0–15.0)
Lymphs Abs: 2.4 10*3/uL (ref 0.7–4.0)
MCH: 27.1 pg (ref 26.0–34.0)
MCV: 86.7 fL (ref 78.0–100.0)
Monocytes Relative: 5 % (ref 3–12)
RBC: 3.61 MIL/uL — ABNORMAL LOW (ref 3.87–5.11)

## 2013-10-28 LAB — POCT I-STAT, CHEM 8
BUN: 16 mg/dL (ref 6–23)
Calcium, Ion: 1.18 mmol/L (ref 1.13–1.30)
Creatinine, Ser: 0.7 mg/dL (ref 0.50–1.10)
TCO2: 22 mmol/L (ref 0–100)

## 2013-10-28 LAB — TROPONIN I: Troponin I: <0.3 ng/mL (ref <0.30)

## 2013-10-28 MED ORDER — DIGOXIN 0.25 MG/ML IJ SOLN
0.2500 mg | Freq: Four times a day (QID) | INTRAMUSCULAR | Status: AC
  Administered 2013-10-28 – 2013-10-29 (×3): 0.25 mg via INTRAVENOUS
  Filled 2013-10-28: qty 2
  Filled 2013-10-28 (×2): qty 1

## 2013-10-28 MED ORDER — POLYETHYLENE GLYCOL 3350 17 G PO PACK
17.0000 g | PACK | Freq: Every morning | ORAL | Status: DC
  Administered 2013-10-29 – 2013-10-31 (×3): 17 g via ORAL
  Filled 2013-10-28 (×3): qty 1

## 2013-10-28 MED ORDER — SODIUM CHLORIDE 0.9 % IV BOLUS (SEPSIS)
500.0000 mL | Freq: Once | INTRAVENOUS | Status: AC
  Administered 2013-10-28: 500 mL via INTRAVENOUS

## 2013-10-28 MED ORDER — ACETAMINOPHEN 325 MG PO TABS
650.0000 mg | ORAL_TABLET | Freq: Four times a day (QID) | ORAL | Status: DC | PRN
  Administered 2013-10-30: 650 mg via ORAL
  Filled 2013-10-28: qty 2

## 2013-10-28 MED ORDER — ONDANSETRON HCL 4 MG/2ML IJ SOLN: 4.0000 mg | Freq: Four times a day (QID) | INTRAMUSCULAR | Status: DC | PRN

## 2013-10-28 MED ORDER — ONDANSETRON HCL 4 MG PO TABS: 4.0000 mg | ORAL_TABLET | Freq: Four times a day (QID) | ORAL | Status: DC | PRN

## 2013-10-28 MED ORDER — PANTOPRAZOLE SODIUM 40 MG PO TBEC
40.0000 mg | DELAYED_RELEASE_TABLET | Freq: Every day | ORAL | Status: DC
  Administered 2013-10-28 – 2013-10-31 (×4): 40 mg via ORAL
  Filled 2013-10-28 (×4): qty 1

## 2013-10-28 MED ORDER — RISPERIDONE 0.25 MG PO TABS
0.2500 mg | ORAL_TABLET | Freq: Two times a day (BID) | ORAL | Status: DC
  Administered 2013-10-28 – 2013-10-31 (×6): 0.25 mg via ORAL
  Filled 2013-10-28 (×7): qty 1

## 2013-10-28 MED ORDER — LOPERAMIDE HCL 2 MG PO CAPS: 2.0000 mg | ORAL_CAPSULE | Freq: Four times a day (QID) | ORAL | Status: DC | PRN

## 2013-10-28 MED ORDER — GABAPENTIN 100 MG PO CAPS
100.0000 mg | ORAL_CAPSULE | Freq: Every morning | ORAL | Status: DC
  Administered 2013-10-29 – 2013-10-31 (×3): 100 mg via ORAL
  Filled 2013-10-28 (×3): qty 1

## 2013-10-28 MED ORDER — VITAMIN B-12 1000 MCG PO TABS
1000.0000 ug | ORAL_TABLET | Freq: Every morning | ORAL | Status: DC
  Administered 2013-10-29 – 2013-10-31 (×3): 1000 ug via ORAL
  Filled 2013-10-28 (×3): qty 1

## 2013-10-28 MED ORDER — SODIUM CHLORIDE 0.9 % IV SOLN
INTRAVENOUS | Status: AC
  Administered 2013-10-28 (×2): via INTRAVENOUS

## 2013-10-28 MED ORDER — ACETAMINOPHEN 650 MG RE SUPP: 650.0000 mg | Freq: Four times a day (QID) | RECTAL | Status: DC | PRN

## 2013-10-28 MED ORDER — DILTIAZEM HCL 25 MG/5ML IV SOLN
2.5000 mg | Freq: Once | INTRAVENOUS | Status: AC
  Administered 2013-10-28: 2.5 mg via INTRAVENOUS
  Filled 2013-10-28: qty 5

## 2013-10-28 MED ORDER — ENOXAPARIN SODIUM 40 MG/0.4ML ~~LOC~~ SOLN
40.0000 mg | SUBCUTANEOUS | Status: DC
  Administered 2013-10-28 – 2013-10-30 (×3): 40 mg via SUBCUTANEOUS
  Filled 2013-10-28 (×4): qty 0.4

## 2013-10-28 MED ORDER — POLYSACCHARIDE IRON COMPLEX 150 MG PO CAPS
150.0000 mg | ORAL_CAPSULE | Freq: Every morning | ORAL | Status: DC
  Administered 2013-10-29 – 2013-10-31 (×3): 150 mg via ORAL
  Filled 2013-10-28 (×3): qty 1

## 2013-10-28 MED ORDER — FLUTICASONE PROPIONATE HFA 44 MCG/ACT IN AERO
1.0000 | INHALATION_SPRAY | Freq: Two times a day (BID) | RESPIRATORY_TRACT | Status: DC
  Administered 2013-10-29 – 2013-10-31 (×4): 1 via RESPIRATORY_TRACT
  Filled 2013-10-28 (×2): qty 10.6

## 2013-10-28 MED ORDER — DILTIAZEM HCL 25 MG/5ML IV SOLN
2.5000 mg | Freq: Once | INTRAVENOUS | Status: AC
  Administered 2013-10-28: 2.5 mg via INTRAVENOUS

## 2013-10-28 MED ORDER — SODIUM CHLORIDE 0.9 % IJ SOLN
3.0000 mL | Freq: Two times a day (BID) | INTRAMUSCULAR | Status: DC
  Administered 2013-10-28 – 2013-10-31 (×6): 3 mL via INTRAVENOUS

## 2013-10-28 MED ORDER — LEVOTHYROXINE SODIUM 50 MCG PO TABS
50.0000 ug | ORAL_TABLET | Freq: Every day | ORAL | Status: DC
  Administered 2013-10-29 – 2013-10-31 (×3): 50 ug via ORAL
  Filled 2013-10-28 (×4): qty 1

## 2013-10-28 MED ORDER — FUROSEMIDE 20 MG PO TABS
20.0000 mg | ORAL_TABLET | Freq: Every morning | ORAL | Status: DC
  Administered 2013-10-29 – 2013-10-31 (×3): 20 mg via ORAL
  Filled 2013-10-28 (×3): qty 1

## 2013-10-28 NOTE — Social Work (Signed)
This is a note for Social Work. Son, [name] [phone]) spoke with me upon admit of patient with his concern for patient to be discharged back to [facility] [address] to the care of [facility] [phone]). Her bed has been released and Hospice informed me that the Provider will have to write an order for her to resume care at [facility]. Thank you.

## 2013-10-28 NOTE — ED Notes (Signed)
Cardiology at bedside.

## 2013-10-28 NOTE — H&P (Signed)
History and Physical Examination  Sharon Fletcher:[id number] DOB: [birth year] DOA: 10/28/2013  Referring physician: Rubin Payor PCP: Astrid Divine, MD  Specialists: Katrinka Blazing, Cardiology  Chief Complaint: low blood pressure  HPI: Sharon Fletcher is a 79 y.o. female hospice comfort care patient with significant end stage dementia, hypothyroidism, anemia, GERD who currently resides with the [facility] residential facility was sent to ER when nursing discovered that the patient's blood pressure was low and her pulse was rapid, thready and irregular.  She was sent to ER by ambulance.  In the ER she was found to be in AFIB with RVR.  She was hypotensive with SBP in the 80s.  Her pulse was in the 150s.  She was seen by cardiology and they have recommended that she be started on digoxin and subsequently transitioned to oral meds to control heart rate when appropriate.  Admission was requested for further management.  The patient is demented but has no complaints of chest pain or SOB and does not appear to be in any distress or discomfort.    Past Medical History Past Medical History  Diagnosis Date  . Dementia   . Anemia   . Hypercholesteremia   . Peripheral neuropathy   . GERD (gastroesophageal reflux disease)   . Restless leg syndrome   . Diverticular disease   . Incontinence   . Thyroid disease     Past Surgical History History reviewed. No pertinent past surgical history.  Home Meds: Prior to Admission medications   Medication Sig Start Date End Date Taking? Authorizing Provider  acetaminophen (TYLENOL) 500 MG tablet Take 1,000 mg by mouth every 8 (eight) hours. Scheduled 0800, 1400, 2000.   Yes Historical Provider, MD  beclomethasone (QVAR) 40 MCG/ACT inhaler Inhale 2 puffs into the lungs 2 (two) times daily.   Yes Historical Provider, MD  carbamide peroxide (DEBROX) 6.5 % otic solution Place 3 drops into both ears every 30 (thirty) days. On the first day of each  month.   Yes Historical Provider, MD  clotrimazole-betamethasone (LOTRISONE) cream Apply 1 application topically 2 (two) times daily.   Yes Historical Provider, MD  fish oil-omega-3 fatty acids 1000 MG capsule Take 1 g by mouth 2 (two) times daily.   Yes Historical Provider, MD  furosemide (LASIX) 20 MG tablet Take 20 mg by mouth every morning.    Yes Historical Provider, MD  gabapentin (NEURONTIN) 100 MG capsule Take 100 mg by mouth every morning.    Yes Historical Provider, MD  guaifenesin (ROBITUSSIN) 100 MG/5ML syrup Take 100 mg by mouth every 4 (four) hours as needed for cough.   Yes Historical Provider, MD  iron polysaccharides (NIFEREX) 150 MG capsule Take 150 mg by mouth every morning.    Yes Historical Provider, MD  levothyroxine (SYNTHROID, LEVOTHROID) 50 MCG tablet Take 50 mcg by mouth every morning.    Yes Historical Provider, MD  loperamide (IMODIUM) 2 MG capsule Take 2 mg by mouth 4 (four) times daily as needed for diarrhea or loose stools.   Yes Historical Provider, MD  nystatin (MYCOSTATIN/NYSTOP) 100000 UNIT/GM POWD Apply 0.5 g topically 2 (two) times daily. To affected areas under breasts   Yes Historical Provider, MD  omeprazole (PRILOSEC) 20 MG capsule Take 20 mg by mouth every morning.    Yes Historical Provider, MD  polyethylene glycol (MIRALAX / GLYCOLAX) packet Take 17 g by mouth every morning.    Yes Historical Provider, MD  PRESCRIPTION MEDICATION Apply 1 mL topically every 8 (eight) hours  as needed (for agitation). Lorazepam 1mg /ml GEL per MAR   Yes Historical Provider, MD  risperiDONE (RISPERDAL) 0.25 MG tablet Take 0.25 mg by mouth 2 (two) times daily.   Yes Historical Provider, MD  Sodium Fluoride (PREVIDENT 5000 DRY MOUTH) 1.1 % PSTE Place 1 application onto teeth 2 (two) times daily.   Yes Historical Provider, MD  vitamin B-12 (CYANOCOBALAMIN) 1000 MCG tablet Take 1,000 mcg by mouth every morning.    Yes Historical Provider, MD    Allergies: Review of patient's  allergies indicates no known allergies.  Social History:  History   Social History  . Marital Status: Widowed    Spouse Name: N/A    Number of Children: N/A  . Years of Education: N/A   Occupational History  . Not on file.   Social History Main Topics  . Smoking status: Never Smoker   . Smokeless tobacco: Not on file  . Alcohol Use: No  . Drug Use: No  . Sexual Activity: Not on file   Other Topics Concern  . Not on file   Social History Narrative  . No narrative on file   Family History: no significant pertinent history reported.  Review of Systems: The patient is demented but denies Chest Pain and SOB.  Pt says that she is hungry.    Physical Exam: Blood pressure 89/49, pulse 60, temperature 97.5 F (36.4 C), temperature source Oral, resp. rate 21, SpO2 97.00%. General appearance: awake, alert, confused,  cooperative, appears stated age and no distress Head: Normocephalic, without obvious abnormality, atraumatic Eyes: negative Nose: Nares normal. Septum midline. Mucosa normal. No drainage or sinus tenderness., no discharge Throat: dry mucus membranes Neck: no adenopathy, no carotid bruit, no JVD, supple, symmetrical, trachea midline and thyroid not enlarged, symmetric, no tenderness/mass/nodules Lungs: clear to auscultation bilaterally and normal percussion bilaterally Heart: irregularly irregular rhythm Abdomen: soft, non-tender; bowel sounds normal; no masses,  no organomegaly Extremities: extremities normal, atraumatic, no cyanosis or edema Pulses: 1+ symmetric Skin: Skin color, texture, turgor normal. No rashes or lesions Neurologic:moving all extremities  Lab  And Imaging results:  Results for orders placed during the hospital encounter of 10/28/13 (from the past 24 hour(s))  CBC WITH DIFFERENTIAL     Status: Abnormal   Collection Time    10/28/13 10:49 AM      Result Value Range   WBC 11.1 (*) 4.0 - 10.5 K/uL   RBC 3.61 (*) 3.87 - 5.11 MIL/uL    Hemoglobin 9.8 (*) 12.0 - 15.0 g/dL   HCT 40.9 (*) 81.1 - 91.4 %   MCV 86.7  78.0 - 100.0 fL   MCH 27.1  26.0 - 34.0 pg   MCHC 31.3  30.0 - 36.0 g/dL   RDW 78.2 (*) 95.6 - 21.3 %   Platelets 275  150 - 400 K/uL   Neutrophils Relative % 73  43 - 77 %   Neutro Abs 8.0 (*) 1.7 - 7.7 K/uL   Lymphocytes Relative 21  12 - 46 %   Lymphs Abs 2.4  0.7 - 4.0 K/uL   Monocytes Relative 5  3 - 12 %   Monocytes Absolute 0.5  0.1 - 1.0 K/uL   Eosinophils Relative 1  0 - 5 %   Eosinophils Absolute 0.1  0.0 - 0.7 K/uL   Basophils Relative 0  0 - 1 %   Basophils Absolute 0.0  0.0 - 0.1 K/uL  MAGNESIUM     Status: None   Collection Time  10/28/13 10:49 AM      Result Value Range   Magnesium 2.2  1.5 - 2.5 mg/dL  PRO B NATRIURETIC PEPTIDE     Status: Abnormal   Collection Time    10/28/13 10:49 AM      Result Value Range   Pro B Natriuretic peptide (BNP) 3585.0 (*) 0 - 450 pg/mL  POCT I-STAT TROPONIN I     Status: None   Collection Time    10/28/13 11:56 AM      Result Value Range   Troponin i, poc 0.01  0.00 - 0.08 ng/mL   Comment 3           POCT I-STAT, CHEM 8     Status: Abnormal   Collection Time    10/28/13 11:57 AM      Result Value Range   Sodium 137  135 - 145 mEq/L   Potassium 4.4  3.5 - 5.1 mEq/L   Chloride 106  96 - 112 mEq/L   BUN 16  6 - 23 mg/dL   Creatinine, Ser 1.47  0.50 - 1.10 mg/dL   Glucose, Bld 829 (*) 70 - 99 mg/dL   Calcium, Ion 5.62  1.30 - 1.30 mmol/L   TCO2 22  0 - 100 mmol/L   Hemoglobin 10.5 (*) 12.0 - 15.0 g/dL   HCT 86.5 (*) 78.4 - 69.6 %   Impression / Plan   1. Atrial fibrillation, unknown duration. CHADS score greater than 1. - appreciate cardiology assistance,   IV Lanoxin to slow the ventricular response ordered, IVF (gentle), checking BNP, per cardiology after heart rate slows and blood pressure improves, diltiazem or beta blocker rather than Lanoxin would be a more appropriate rate controlling agent. Tele bed ordered so we can more easily monitor  heart rate.  Check TSH level.   2. Hypotension - gentle IVFs as ordered  3. Severe end-stage dementia - Pt is at high risk for sundowning and delerium.  I spoke with pt's son [name] who reports that pt is also fall risk.  Will exercise fall precautions.    4. Hospice as assisted living facility because of problem #3. - Will try to return to facility and hospice care as soon as possible.  Pt's son is frustrated that patient is being sent to ER so many times when he has expressed that he wanted comfort care for his mother and to try and avoid the trauma of multiple ER visits, etc.    5. Anemia - will monitor  Code Status DNR/DNI   Standley Dakins MD [facility] [place], [place] [phone] 10/28/2013, 2:38 PM

## 2013-10-28 NOTE — ED Notes (Signed)
Per EMS pt from [facility] with c/o Atrial Fibrillation. Pt did not get out of bed this am for breakfast. Nurses did a pulse check and were having a hard time feeling it, pt also had BP of 80. No hx of a-fibrillation. HR between 120-160. BP 112/75. Alert to self. Hx of dementia. IV 22 left FA.

## 2013-10-28 NOTE — ED Provider Notes (Signed)
CSN: [id number]     Arrival date & time 10/28/13  1025 History   First MD Initiated Contact with Patient 10/28/13 1029     Chief Complaint  Patient presents with  . Atrial Fibrillation   level V caveat due to dementia. (Consider location/radiation/quality/duration/timing/severity/associated sxs/prior Treatment) Patient is a 79 y.o. female presenting with atrial fibrillation. The history is provided by the patient and the EMS personnel.  Atrial Fibrillation   patient was brought in from the nursing home. She usually comes down to breakfast. This morning she did not. She was found to have rapid heart rate and minimal blood pressure. No history of atrial fibrillation.  Past Medical History  Diagnosis Date  . Dementia   . Anemia   . Hypercholesteremia   . Peripheral neuropathy   . GERD (gastroesophageal reflux disease)   . Restless leg syndrome   . Diverticular disease   . Incontinence   . Thyroid disease    History reviewed. No pertinent past surgical history. No family history on file. History  Substance Use Topics  . Smoking status: Never Smoker   . Smokeless tobacco: Not on file  . Alcohol Use: No   OB History   Grav Para Term Preterm Abortions TAB SAB Ect Mult Living                 Review of Systems  Unable to perform ROS   Allergies  Review of patient's allergies indicates no known allergies.  Home Medications   Current Outpatient Rx  Name  Route  Sig  Dispense  Refill  . acetaminophen (TYLENOL) 500 MG tablet   Oral   Take 1,000 mg by mouth every 8 (eight) hours. Scheduled 0800, 1400, 2000.         . beclomethasone (QVAR) 40 MCG/ACT inhaler   Inhalation   Inhale 2 puffs into the lungs 2 (two) times daily.         . carbamide peroxide (DEBROX) 6.5 % otic solution   Both Ears   Place 3 drops into both ears every 30 (thirty) days. On the first day of each month.         . clotrimazole-betamethasone (LOTRISONE) cream   Topical   Apply 1 application  topically 2 (two) times daily.         . fish oil-omega-3 fatty acids 1000 MG capsule   Oral   Take 1 g by mouth 2 (two) times daily.         . furosemide (LASIX) 20 MG tablet   Oral   Take 20 mg by mouth every morning.          . gabapentin (NEURONTIN) 100 MG capsule   Oral   Take 100 mg by mouth every morning.          Marland Kitchen guaifenesin (ROBITUSSIN) 100 MG/5ML syrup   Oral   Take 100 mg by mouth every 4 (four) hours as needed for cough.         . iron polysaccharides (NIFEREX) 150 MG capsule   Oral   Take 150 mg by mouth every morning.          Marland Kitchen levothyroxine (SYNTHROID, LEVOTHROID) 50 MCG tablet   Oral   Take 50 mcg by mouth every morning.          . loperamide (IMODIUM) 2 MG capsule   Oral   Take 2 mg by mouth 4 (four) times daily as needed for diarrhea or loose stools.         Marland Kitchen  nystatin (MYCOSTATIN/NYSTOP) 100000 UNIT/GM POWD   Topical   Apply 0.5 g topically 2 (two) times daily. To affected areas under breasts         . omeprazole (PRILOSEC) 20 MG capsule   Oral   Take 20 mg by mouth every morning.          . polyethylene glycol (MIRALAX / GLYCOLAX) packet   Oral   Take 17 g by mouth every morning.          . risperiDONE (RISPERDAL) 0.25 MG tablet   Oral   Take 0.25 mg by mouth 2 (two) times daily.         . Sodium Fluoride (PREVIDENT 5000 DRY MOUTH) 1.1 % PSTE   dental   Place 1 application onto teeth 2 (two) times daily.         . vitamin B-12 (CYANOCOBALAMIN) 1000 MCG tablet   Oral   Take 1,000 mcg by mouth every morning.          . digoxin (LANOXIN) 0.125 MG tablet   Oral   Take 1 tablet (0.125 mg total) by mouth daily.   30 tablet   0   . LORazepam (ATIVAN) 1 MG tablet   Sublingual   Place 1 tablet (1 mg total) under the tongue every 8 (eight) hours as needed (agitation.).   10 tablet   0   . metoprolol tartrate (LOPRESSOR) 25 MG tablet   Oral   Take 1 tablet (25 mg total) by mouth 2 (two) times daily.   60  tablet   0    BP 103/65  Pulse 143  Temp(Src) 97.9 F (36.6 C) (Oral)  Resp 18  Ht 5\' 2"  (1.575 m)  Wt 173 lb 6.4 oz (78.654 kg)  BMI 31.71 kg/m2  SpO2 98% Physical Exam  Constitutional: She appears well-developed and well-nourished.  HENT:  Head: Normocephalic.  Eyes: Pupils are equal, round, and reactive to light.  Mild exophthalmos  Neck: Neck supple.  Cardiovascular:  Tachycardia  Pulmonary/Chest: Effort normal and breath sounds normal.  Abdominal: Soft. There is no tenderness.  Musculoskeletal: Normal range of motion.  Neurological: She is alert.  Patient is demented. At baseline per the son.  Skin: There is pallor.    ED Course  Procedures (including critical care time) Labs Review Labs Reviewed  CBC WITH DIFFERENTIAL - Abnormal; Notable for the following:    WBC 11.1 (*)    RBC 3.61 (*)    Hemoglobin 9.8 (*)    HCT 31.3 (*)    RDW 16.5 (*)    Neutro Abs 8.0 (*)    All other components within normal limits  PRO B NATRIURETIC PEPTIDE - Abnormal; Notable for the following:    Pro B Natriuretic peptide (BNP) 3585.0 (*)    All other components within normal limits  POCT I-STAT, CHEM 8 - Abnormal; Notable for the following:    Glucose, Bld 102 (*)    Hemoglobin 10.5 (*)    HCT 31.0 (*)    All other components within normal limits  TSH  MAGNESIUM  TROPONIN I  TROPONIN I  POCT I-STAT TROPONIN I   Imaging Review No results found.  EKG Interpretation    Date/Time:    Ventricular Rate:    PR Interval:    QRS Duration:   QT Interval:    QTC Calculation:   R Axis:     Text Interpretation:  Date: 11/01/2013  Rate: 142  Rhythm: atrial fibrillation  QRS Axis: normal  Intervals: afib  ST/T Wave abnormalities: repol abnormality  Conduction Disutrbances:afib  Narrative Interpretation:   Old EKG Reviewed: changes noted   MDM   1. Hypotension (arterial)   2. Atrial fibrillation   3. Dementia   4. Anemia   5. Hypotension,  unspecified   6. Acute diastolic CHF (congestive heart failure)   7. Hypothyroidism    Patient with altered mental status. Hypotensive and atrial fibrillation. History of dementia. His mild anemia. Has had small dose of Cardizem in the ED to attempt rate control. Continued hypertension. Patient has been given IV fluids. Seen in the ED by cardiology. Recommended starting digoxin. Patient's had IV fluid boluses. She's a hospice patient and will be admitted to internal medicine.  CRITICAL CARE Performed by: Billee Cashing Total critical care time: 30 Critical care time was exclusive of separately billable procedures and treating other patients. Critical care was necessary to treat or prevent imminent or life-threatening deterioration. Critical care was time spent personally by me on the following activities: development of treatment plan with patient and/or surrogate as well as nursing, discussions with consultants, evaluation of patient's response to treatment, examination of patient, obtaining history from patient or surrogate, ordering and performing treatments and interventions, ordering and review of laboratory studies, ordering and review of radiographic studies, pulse oximetry and re-evaluation of patient's condition.     Juliet Rude. Rubin Payor, MD 11/01/13 [phone]

## 2013-10-28 NOTE — ED Notes (Signed)
Son at bedside.

## 2013-10-28 NOTE — ED Notes (Signed)
Report given to Judith, RN.

## 2013-10-28 NOTE — Progress Notes (Signed)
Patient refused lab draw, Dr Laural Benes made aware.

## 2013-10-28 NOTE — Consult Note (Addendum)
Name: Sharon Fletcher is a 79 y.o. female Admit date: 10/28/2013 Referring Physician:  Dr. Delma Post DD Primary Physician:  Maurice Small, M.D. Primary Cardiologist:  None  Reason for Consultation:  Atrial fibrillation with low blood pressure  ASSESSMENT: 1. Atrial fibrillation, unknown duration. CHADS score greater than 1. 2. Hypotension 3. Severe end-stage dementia 4. Hospice as assisted living facility because of problem #3.  PLAN: 1. Check BNP. If extremely elevated we will need to be very careful with fluid administration. .  2. IV normal saline to help volume expand the patient and support blood pressure. 3. IV Lanoxin to slow the ventricular response. 4. After heart rate slows and blood pressure improves, diltiazem or beta blocker rather than Lanoxin would be a more appropriate rate controlling agent 5. Given her hospice status and history of anemia, anticoagulation therapy would not be reasonable despite her high CHADS score.   HPI: 79 year old sent to the emergency room from her assisted living facility because of fast heart rate and weakness. Vital signs in the emergency room demonstrated systolic blood pressure less than 90 and heart rates above 100. EKG demonstrated atrial fibrillation with rapid ventricular response. No prior history of atrial fibrillation. Patient has no specific complaints although she is terribly confused and not able to engage in meaningful conversation. A relative at the bedside tells me that she is on outpatient hospice and lives in the assisted living facility because of end-stage dementia.  PMH:   Past Medical History  Diagnosis Date  . Dementia   . Anemia   . Hypercholesteremia   . Peripheral neuropathy   . GERD (gastroesophageal reflux disease)   . Restless leg syndrome   . Diverticular disease   . Incontinence   . Thyroid disease     PSH:  History reviewed. No pertinent past surgical history. Allergies:  Review of patient's  allergies indicates no known allergies. Prior to Admit Meds:   (Not in a hospital admission) Fam HX:   No family history on file. Social HX:    History   Social History  . Marital Status: Widowed    Spouse Name: N/A    Number of Children: N/A  . Years of Education: N/A   Occupational History  . Not on file.   Social History Main Topics  . Smoking status: Never Smoker   . Smokeless tobacco: Not on file  . Alcohol Use: No  . Drug Use: No  . Sexual Activity: Not on file   Other Topics Concern  . Not on file   Social History Narrative  . No narrative on file     Review of Systems: Not obtainable  Physical Exam: Blood pressure 89/49, pulse 60, temperature 97.5 F (36.4 C), temperature source Oral, resp. rate 21, SpO2 97.00%. Weight change:   Appears extremely pale. She is in no distress. She is asking to eat.  Neck veins are flat with the patient lying at 30  Chest is clear anteriorly  Cardiac exam reveals a rapid irregular rhythm with a 2-3 of 6 systolic murmur heard at the apex compatible with mitral regurgitation  Abdomen is soft. Bowel sounds are normal.  There is no peripheral edema  Neurological exam reveals a patient who is unable to engage in meaningful conversation. She is disoriented. She is pleasant and noncombative. Labs: Lab Results  Component Value Date   WBC 11.1* 10/28/2013   HGB 10.5* 10/28/2013   HCT 31.0* 10/28/2013   MCV 86.7 10/28/2013   PLT 275  10/28/2013    Recent Labs Lab 10/28/13 1157  NA 137  K 4.4  CL 106  BUN 16  CREATININE 0.70  GLUCOSE 102*   No results found for this basename: PTT   Lab Results  Component Value Date   INR 1.07 05/23/2012   Lab Results  Component Value Date   CKTOTAL 35 05/24/2012   CKMB 2.8 05/24/2012   TROPONINI <0.30 05/31/2013        Radiology:  Dg Chest Port 1 View  10/28/2013   CLINICAL DATA:  Afib.  EXAM: PORTABLE CHEST - 1 VIEW  COMPARISON:  05/31/2013.  FINDINGS: Mediastinum and hilar  structures normal. Lungs are clear. No pleural effusion. No pneumothorax. Cardiomegaly with normal pulmonary vascularity. No CHF. Prominent degenerative changes noted both shoulders with deformity of the right humeral head. Calcific densities projected over the left shoulder may represent loose bodies.  IMPRESSION: 1. Stable cardiomegaly, no CHF.  No acute cardiopulmonary disease. 2. Severe degenerative changes both shoulders.   Electronically Signed   By: Maisie Fus  Register   On: 10/28/2013 11:24   EKG:  Atrial fibrillation with rapid ventricular response, prominent voltage compatible with LVH, and nonspecific ST abnormality anterolateral leads.    Lesleigh Noe 10/28/2013 1:41 PM

## 2013-10-29 DIAGNOSIS — I509 Heart failure, unspecified: Secondary | ICD-10-CM

## 2013-10-29 DIAGNOSIS — I5031 Acute diastolic (congestive) heart failure: Secondary | ICD-10-CM

## 2013-10-29 LAB — TROPONIN I: Troponin I: <0.3 ng/mL (ref <0.30)

## 2013-10-29 MED ORDER — METOPROLOL TARTRATE 25 MG PO TABS
25.0000 mg | ORAL_TABLET | Freq: Two times a day (BID) | ORAL | Status: DC
  Administered 2013-10-29 – 2013-10-31 (×4): 25 mg via ORAL
  Filled 2013-10-29 (×4): qty 1

## 2013-10-29 MED ORDER — DIGOXIN 250 MCG PO TABS
0.2500 mg | ORAL_TABLET | Freq: Every day | ORAL | Status: DC
  Administered 2013-10-29 – 2013-10-30 (×2): 0.25 mg via ORAL
  Filled 2013-10-29 (×3): qty 1

## 2013-10-29 MED ORDER — METOPROLOL TARTRATE 25 MG/10 ML ORAL SUSPENSION
25.0000 mg | Freq: Two times a day (BID) | ORAL | Status: DC
  Filled 2013-10-29: qty 10

## 2013-10-29 MED ORDER — DIGOXIN 0.25 MG/ML IJ SOLN: 0.2500 mg | Freq: Every day | INTRAMUSCULAR | Status: DC

## 2013-10-29 NOTE — Progress Notes (Signed)
[facility] PROGRESS NOTE    Sharon Fletcher ZOX:[id number] DOB: [birth year] DOA: 10/28/2013 PCP: Astrid Divine, MD  HPI/Brief narrative 79 y.o. female hospice comfort care patient with significant end stage dementia, hypothyroidism, anemia, GERD who currently resides with the [facility] residential facility was sent to ER when nursing discovered that the patient's blood pressure was low and her pulse was rapid, thready and irregular. In the ED, patient was hypotensive and was in A. fib with RVR. Hospitalist admission requested.  Assessment/Plan:  A. fib with RVR - Patient was admitted to telemetry and started on IV digoxin as per cardiology recommendations. - Continues to have ventricular rate in the 100-120s. - Cardiology have changed digoxin to by mouth. We'll also recommend changing to diltiazem on a beta blocker rather than Lanoxin, when blood pressure improves and heart rate stabilizes. - Given her hospice status, history of anemia, anticoagulation therapy considered unreasonable at this time.   Hypotension - Improved  End-stage dementia - Return to hospice care as soon as possible  Chronic diastolic CHF - Seems compensated clinically.  Anemia - Stable   DVT prophylaxis:  Lovenox Code Status:  DO NOT RESUSCITATE Family Communication:  None- left message for son- unable to reach. Disposition Plan:  return to SNF when medically stable   Consultants:  Cardiology  Procedures:   None  Antibiotics:   None   Subjective:  patient is somnolent and nonverbal.  Objective: Filed Vitals:   10/28/13 2100 10/29/13 0500 10/29/13 0935 10/29/13 1333  BP: 95/75 112/69    Pulse: 145 76  140  Temp: 98.2 F (36.8 C) 98 F (36.7 C)    TempSrc:      Resp: 18 18    SpO2: 98% 98% 96%     Intake/Output Summary (Last 24 hours) at 10/29/13 1600 Last data filed at 10/29/13 1018  Gross per 24 hour  Intake    243 ml  Output      0 ml  Net    243 ml    There were no vitals filed for this visit.   Exam:   General exam:  elderly female lying comfortably supine in bed. Respiratory system: Clear. No increased work of breathing. Cardiovascular system: S1 & S2 heard,  irregularly regular and mildly tachycardic. No JVD, murmurs, gallops, clicks or pedal edema. Gastrointestinal system: Abdomen is nondistended, soft and nontender. Normal bowel sounds heard. Central nervous system:  somnolent, arousable to touch and briefly opens eyes and goes back to sleep. Not oriented . No focal neurological deficits.   Data Reviewed: Basic Metabolic Panel:  Recent Labs Lab 10/28/13 1049 10/28/13 1157  NA  --  137  K  --  4.4  CL  --  106  GLUCOSE  --  102*  BUN  --  16  CREATININE  --  0.70  MG 2.2  --    Liver Function Tests: No results found for this basename: AST, ALT, ALKPHOS, BILITOT, PROT, ALBUMIN,  in the last 168 hours No results found for this basename: LIPASE, AMYLASE,  in the last 168 hours No results found for this basename: AMMONIA,  in the last 168 hours CBC:  Recent Labs Lab 10/28/13 1049 10/28/13 1157  WBC 11.1*  --   NEUTROABS 8.0*  --   HGB 9.8* 10.5*  HCT 31.3* 31.0*  MCV 86.7  --   PLT 275  --    Cardiac Enzymes:  Recent Labs Lab 10/28/13 2140 10/29/13 0515  TROPONINI <0.30 <0.30  BNP (last 3 results)  Recent Labs  10/28/13 1049  PROBNP 3585.0*   CBG: No results found for this basename: GLUCAP,  in the last 168 hours  No results found for this or any previous visit (from the past 240 hour(s)).    Additional labs: 1. None     Studies: Dg Chest Port 1 View  10/28/2013   CLINICAL DATA:  Afib.  EXAM: PORTABLE CHEST - 1 VIEW  COMPARISON:  05/31/2013.  FINDINGS: Mediastinum and hilar structures normal. Lungs are clear. No pleural effusion. No pneumothorax. Cardiomegaly with normal pulmonary vascularity. No CHF. Prominent degenerative changes noted both shoulders with deformity of the right  humeral head. Calcific densities projected over the left shoulder may represent loose bodies.  IMPRESSION: 1. Stable cardiomegaly, no CHF.  No acute cardiopulmonary disease. 2. Severe degenerative changes both shoulders.   Electronically Signed   By: Maisie Fus  Register   On: 10/28/2013 11:24        Scheduled Meds: . digoxin  0.25 mg Oral Daily  . enoxaparin (LOVENOX) injection  40 mg Subcutaneous Q24H  . fluticasone  1 puff Inhalation BID  . furosemide  20 mg Oral q morning - 10a  . gabapentin  100 mg Oral q morning - 10a  . iron polysaccharides  150 mg Oral q morning - 10a  . levothyroxine  50 mcg Oral Q breakfast  . pantoprazole  40 mg Oral Daily  . polyethylene glycol  17 g Oral q morning - 10a  . risperiDONE  0.25 mg Oral BID  . sodium chloride  3 mL Intravenous Q12H  . vitamin B-12  1,000 mcg Oral q morning - 10a   Continuous Infusions:   Active Problems:   Hypotension, unspecified   Anemia   Hypothyroidism   Dementia   Atrial fibrillation   Acute diastolic CHF (congestive heart failure)    Time spent: 25 mins    HONGALGI,ANAND, MD, FACP, FHM. [facility] Pager [phone]  If 7PM-7AM, please contact night-coverage [email or web address] Password [email or web address] 10/29/2013, 4:00 PM    LOS: 1 day

## 2013-10-29 NOTE — Progress Notes (Signed)
SUBJECTIVE:   No complaints.  Pleasantly demented  OBJECTIVE:   Vitals:   Filed Vitals:   10/28/13 1430 10/28/13 2100 10/29/13 0500 10/29/13 0935  BP: 93/75 95/75 112/69   Pulse: 96 145 76   Temp:  98.2 F (36.8 C) 98 F (36.7 C)   TempSrc:      Resp: 18 18 18    SpO2: 100% 98% 98% 96%   I&O's:   Intake/Output Summary (Last 24 hours) at 10/29/13 1024 Last data filed at 10/29/13 1018  Gross per 24 hour  Intake    243 ml  Output      0 ml  Net    243 ml   TELEMETRY: Reviewed telemetry pt in atrial fibrillation with RVR     PHYSICAL EXAM General: Well developed, well nourished, in no acute distress Head: Eyes PERRLA, No xanthomas.   Normal cephalic and atramatic  Lungs:   Clear bilaterally to auscultation and percussion. Heart:   Irregularly irregular and tachy S1 S2 Pulses are 2+ & equal. Abdomen: Bowel sounds are positive, abdomen soft and non-tender without masses  Extremities:   No clubbing, cyanosis or edema.  DP +1 Neuro: Alert and oriented X 3. Psych:  Good affect, responds appropriately   LABS: Basic Metabolic Panel:  Recent Labs  86/57/84 1049 10/28/13 1157  NA  --  137  K  --  4.4  CL  --  106  GLUCOSE  --  102*  BUN  --  16  CREATININE  --  0.70  MG 2.2  --    Liver Function Tests: No results found for this basename: AST, ALT, ALKPHOS, BILITOT, PROT, ALBUMIN,  in the last 72 hours No results found for this basename: LIPASE, AMYLASE,  in the last 72 hours CBC:  Recent Labs  10/28/13 1049 10/28/13 1157  WBC 11.1*  --   NEUTROABS 8.0*  --   HGB 9.8* 10.5*  HCT 31.3* 31.0*  MCV 86.7  --   PLT 275  --    Cardiac Enzymes:  Recent Labs  10/28/13 2140 10/29/13 0515  TROPONINI <0.30 <0.30   BNP: No components found with this basename: POCBNP,  D-Dimer: No results found for this basename: DDIMER,  in the last 72 hours Hemoglobin A1C: No results found for this basename: HGBA1C,  in the last 72 hours Fasting Lipid Panel: No results  found for this basename: CHOL, HDL, LDLCALC, TRIG, CHOLHDL, LDLDIRECT,  in the last 72 hours Thyroid Function Tests:  Recent Labs  10/28/13 1049  TSH 1.816   Anemia Panel: No results found for this basename: VITAMINB12, FOLATE, FERRITIN, TIBC, IRON, RETICCTPCT,  in the last 72 hours Coag Panel:   Lab Results  Component Value Date   INR 1.07 05/23/2012    RADIOLOGY: Dg Chest Port 1 View  10/28/2013   CLINICAL DATA:  Afib.  EXAM: PORTABLE CHEST - 1 VIEW  COMPARISON:  05/31/2013.  FINDINGS: Mediastinum and hilar structures normal. Lungs are clear. No pleural effusion. No pneumothorax. Cardiomegaly with normal pulmonary vascularity. No CHF. Prominent degenerative changes noted both shoulders with deformity of the right humeral head. Calcific densities projected over the left shoulder may represent loose bodies.  IMPRESSION: 1. Stable cardiomegaly, no CHF.  No acute cardiopulmonary disease. 2. Severe degenerative changes both shoulders.   Electronically Signed   By: Maisie Fus  Register   On: 10/28/2013 11:24   ASSESSMENT:  1. Atrial fibrillation, unknown duration. CHADS score greater than 1. HR still poorly controlled.  Rate  controlling drugs are limited due to patient's low BP. 2. Hypotension  3. Severe end-stage dementia  4. Hospice as assisted living facility because of problem #3.  5.  Acute on chronic diastolic CHF with elevated BNP - appears euvolemic today on exam.  I suspect elevated BNP was from acute RVR PLAN:  1. PO Lanoxin to slow the ventricular response 0.25mg  daily 2. After heart rate slows and blood pressure improves, diltiazem or beta blocker rather than Lanoxin would be a more appropriate rate controlling agent  3. Given her hospice status and history of anemia, anticoagulation therapy would not be reasonable despite her high CHADS score.  4.  Hold Lasix for now       Quintella Reichert, MD  10/29/2013  10:24 AM

## 2013-10-29 NOTE — Progress Notes (Signed)
Pt  Resting on bed comfortable, continues on AFib on monitor with HR sustained on 160's, MD notified, we'll continue to monitor.

## 2013-10-30 MED ORDER — METOPROLOL TARTRATE 25 MG PO TABS: 25.0000 mg | ORAL_TABLET | Freq: Two times a day (BID) | ORAL | Status: DC

## 2013-10-30 NOTE — Progress Notes (Addendum)
SUBJECTIVE:  No complaints  OBJECTIVE:   Vitals:   Filed Vitals:   10/29/13 2046 10/29/13 2119 10/30/13 0515 10/30/13 0818  BP: 129/81  127/77   Pulse: 145  140   Temp: 97.8 F (36.6 C)  100.2 F (37.9 C)   TempSrc: Oral  Oral   Resp: 18  18   Height:      Weight:      SpO2: 95% 96% 95% 97%   I&O's:   Intake/Output Summary (Last 24 hours) at 10/30/13 1031 Last data filed at 10/29/13 2212  Gross per 24 hour  Intake    700 ml  Output      0 ml  Net    700 ml   TELEMETRY: Reviewed telemetry pt in atrial fibrillation with HR 134bpm:     PHYSICAL EXAM General: Well developed, well nourished, in no acute distress Head: Eyes PERRLA, No xanthomas.   Normal cephalic and atramatic  Lungs:   Clear bilaterally to auscultation and percussion. Heart:   Irregularly irregular and tachy S1 S2 Pulses are 2+ & equal. Abdomen: Bowel sounds are positive, abdomen soft and non-tender without masses  Extremities:   No clubbing, cyanosis or edema.  DP +1 Neuro: Alert and oriented X 3. Psych:  Good affect, responds appropriately   LABS: Basic Metabolic Panel:  Recent Labs  16/10/96 1049 10/28/13 1157  NA  --  137  K  --  4.4  CL  --  106  GLUCOSE  --  102*  BUN  --  16  CREATININE  --  0.70  MG 2.2  --    Liver Function Tests: No results found for this basename: AST, ALT, ALKPHOS, BILITOT, PROT, ALBUMIN,  in the last 72 hours No results found for this basename: LIPASE, AMYLASE,  in the last 72 hours CBC:  Recent Labs  10/28/13 1049 10/28/13 1157  WBC 11.1*  --   NEUTROABS 8.0*  --   HGB 9.8* 10.5*  HCT 31.3* 31.0*  MCV 86.7  --   PLT 275  --    Cardiac Enzymes:  Recent Labs  10/28/13 2140 10/29/13 0515  TROPONINI <0.30 <0.30   BNP: No components found with this basename: POCBNP,  D-Dimer: No results found for this basename: DDIMER,  in the last 72 hours Hemoglobin A1C: No results found for this basename: HGBA1C,  in the last 72 hours Fasting Lipid  Panel: No results found for this basename: CHOL, HDL, LDLCALC, TRIG, CHOLHDL, LDLDIRECT,  in the last 72 hours Thyroid Function Tests:  Recent Labs  10/28/13 1049  TSH 1.816   Anemia Panel: No results found for this basename: VITAMINB12, FOLATE, FERRITIN, TIBC, IRON, RETICCTPCT,  in the last 72 hours Coag Panel:   Lab Results  Component Value Date   INR 1.07 05/23/2012    RADIOLOGY: Dg Chest Port 1 View  10/28/2013   CLINICAL DATA:  Afib.  EXAM: PORTABLE CHEST - 1 VIEW  COMPARISON:  05/31/2013.  FINDINGS: Mediastinum and hilar structures normal. Lungs are clear. No pleural effusion. No pneumothorax. Cardiomegaly with normal pulmonary vascularity. No CHF. Prominent degenerative changes noted both shoulders with deformity of the right humeral head. Calcific densities projected over the left shoulder may represent loose bodies.  IMPRESSION: 1. Stable cardiomegaly, no CHF.  No acute cardiopulmonary disease. 2. Severe degenerative changes both shoulders.   Electronically Signed   By: Maisie Fus  Register   On: 10/28/2013 11:24    ASSESSMENT:  1. Atrial fibrillation, unknown duration. CHADS  score greater than 1. HR still poorly controlled. Rate controlling drugs have been limited due to patient's low BP.  2. Hypotension - resolved 3. Severe end-stage dementia  4. Hospice as assisted living facility because of problem #3.  5. Acute on chronic diastolic CHF with elevated BNP - appears euvolemic today on exam. I suspect elevated BNP was from acute RVR  PLAN:  1. PO Lanoxin to slow the ventricular response 0.25mg  daily  2.  Lopressor 25mg  BID for rate control started late last night 3. Given her hospice status and history of anemia, anticoagulation therapy would not be reasonable despite her high CHADS score.  4.  Continue low dose lasix      Quintella Reichert, MD  10/30/2013  10:31 AM

## 2013-10-30 NOTE — Progress Notes (Signed)
Patient VW:UJWJX S Starlin      DOB: 03-29-1929      BJY:[id number]  Received consult to talk with patient's son [name].  He is currently in [place] and not available to meet .  He could meet in person tomorrow at 530 pm.  If his mom is eligible for discharge we could talk with the hospice that is caring for her about his concerns which are that he wants comfort care with no return to the hospital .  I will find out which hospice they are working with.    Chrystel Barefield L. Ladona Ridgel, MD MBA The Palliative Medicine Team at [facility] Phone: [phone] Pager: [phone]

## 2013-10-30 NOTE — Progress Notes (Addendum)
[facility] PROGRESS NOTE    Sharon Fletcher EAV:[id number] DOB: 03/24/1929 DOA: 10/28/2013 PCP: Astrid Divine, MD  HPI/Brief narrative 79 y.o. female hospice comfort care patient with significant end stage dementia, hypothyroidism, anemia, GERD who currently resides with the [facility] residential facility was sent to ER when nursing discovered that the patient's blood pressure was low and her pulse was rapid, thready and irregular. In the ED, patient was hypotensive and was in A. fib with RVR. Hospitalist admission requested.  Assessment/Plan:  A. fib with RVR - Patient was admitted to telemetry and started on IV digoxin as per cardiology recommendations. - Cardiology have changed digoxin to by mouth. They also recommend changing to diltiazem on a beta blocker rather than Lanoxin, when blood pressure improves and heart rate stabilizes. - Given her hospice status, history of anemia, anticoagulation therapy considered unreasonable at this time. - Metoprolol started last night for RVR-monitor response   Hypotension - Resolved  End-stage dementia - Return to hospice care as soon as possible - Mental status said to be at baseline. Somnolent in the mornings probably secondary to risperidone.  Chronic diastolic CHF - Seems compensated clinically.  Anemia - Stable   DVT prophylaxis:  Lovenox Code Status:  DO NOT RESUSCITATE Family Communication:  Discussed with son Mr. [name] - would like to meet with Palliative Care team to further clarify GOC. Disposition Plan:  return to SNF when medically stable   Consultants:  Cardiology  Procedures:   None  Antibiotics:   None   Subjective: Patient again somnolent this morning but arousable, briefly mumbles incomprehensibly and returns for sleep. As per discussion with nursing, patient actually had been awake and alert most of yesterday but confused, fed herself and this apparently is her baseline  mental status as per discussion with family.  Objective: Filed Vitals:   10/30/13 0515 10/30/13 0818 10/30/13 1126 10/30/13 1127  BP: 127/77   96/74  Pulse: 140  97   Temp: 100.2 F (37.9 C)     TempSrc: Oral     Resp: 18     Height:      Weight:      SpO2: 95% 97%      Intake/Output Summary (Last 24 hours) at 10/30/13 1149 Last data filed at 10/29/13 2212  Gross per 24 hour  Intake    700 ml  Output      0 ml  Net    700 ml   Filed Weights   10/29/13 2027  Weight: 78.654 kg (173 lb 6.4 oz)     Exam:   General exam:  elderly female lying comfortably supine in bed. Respiratory system: Clear. No increased work of breathing. Cardiovascular system: S1 & S2 heard,  irregularly regular and mildly tachycardic. No JVD, murmurs, gallops, clicks or pedal edema. Telemetry: A. fib with ventricular rate in the 120s to 130s Gastrointestinal system: Abdomen is nondistended, soft and nontender. Normal bowel sounds heard. Central nervous system:  somnolent, arousable to touch and briefly opens eyes and goes back to sleep. Not oriented . No focal neurological deficits.   Data Reviewed: Basic Metabolic Panel:  Recent Labs Lab 10/28/13 1049 10/28/13 1157  NA  --  137  K  --  4.4  CL  --  106  GLUCOSE  --  102*  BUN  --  16  CREATININE  --  0.70  MG 2.2  --    Liver Function Tests: No results found for this basename: AST, ALT, ALKPHOS,  BILITOT, PROT, ALBUMIN,  in the last 168 hours No results found for this basename: LIPASE, AMYLASE,  in the last 168 hours No results found for this basename: AMMONIA,  in the last 168 hours CBC:  Recent Labs Lab 10/28/13 1049 10/28/13 1157  WBC 11.1*  --   NEUTROABS 8.0*  --   HGB 9.8* 10.5*  HCT 31.3* 31.0*  MCV 86.7  --   PLT 275  --    Cardiac Enzymes:  Recent Labs Lab 10/28/13 2140 10/29/13 0515  TROPONINI <0.30 <0.30   BNP (last 3 results)  Recent Labs  10/28/13 1049  PROBNP 3585.0*   CBG: No results found for this  basename: GLUCAP,  in the last 168 hours  No results found for this or any previous visit (from the past 240 hour(s)).    Additional labs: 1. None     Studies: No results found.      Scheduled Meds: . digoxin  0.25 mg Oral Daily  . enoxaparin (LOVENOX) injection  40 mg Subcutaneous Q24H  . fluticasone  1 puff Inhalation BID  . furosemide  20 mg Oral q morning - 10a  . gabapentin  100 mg Oral q morning - 10a  . iron polysaccharides  150 mg Oral q morning - 10a  . levothyroxine  50 mcg Oral Q breakfast  . metoprolol tartrate  25 mg Oral BID  . pantoprazole  40 mg Oral Daily  . polyethylene glycol  17 g Oral q morning - 10a  . risperiDONE  0.25 mg Oral BID  . sodium chloride  3 mL Intravenous Q12H  . vitamin B-12  1,000 mcg Oral q morning - 10a   Continuous Infusions:   Active Problems:   Hypotension, unspecified   Anemia   Hypothyroidism   Dementia   Atrial fibrillation   Acute diastolic CHF (congestive heart failure)    Time spent: 25 mins    Antonio Creswell, MD, FACP, FHM. [facility] Pager [phone]  If 7PM-7AM, please contact night-coverage [email or web address] Password [email or web address] 10/30/2013, 11:49 AM    LOS: 2 days

## 2013-10-31 MED ORDER — METOPROLOL TARTRATE 25 MG PO TABS: 25.0000 mg | ORAL_TABLET | Freq: Two times a day (BID) | ORAL | Status: AC

## 2013-10-31 MED ORDER — DIGOXIN 125 MCG PO TABS: 0.1250 mg | ORAL_TABLET | Freq: Every day | ORAL | Status: AC

## 2013-10-31 MED ORDER — LORAZEPAM 1 MG PO TABS: 1.0000 mg | ORAL_TABLET | Freq: Three times a day (TID) | ORAL | Status: AC | PRN

## 2013-10-31 MED ORDER — DIGOXIN 125 MCG PO TABS
0.1250 mg | ORAL_TABLET | Freq: Every day | ORAL | Status: DC
  Administered 2013-10-31: 0.125 mg via ORAL
  Filled 2013-10-31: qty 1

## 2013-10-31 NOTE — Discharge Instructions (Signed)
Hospice Care  °Hospice is a care service which can be used by people who are terminally ill and in whom healing is no longer thought possible. Hospice care is for people believed to have no more than 6 months to live. It is meant to help with the two largest fears near the end of life (the fears of dying and of being alone), as well as pain management, and an attempt to allow people to pass away comfortably at home.  °Hospice staff: °· Administer appropriate pain relief. °· Provide nursing care. °· Offer reassurance and support to loved ones and family members. °· Provide services to keep people comfortable at home or in a hospice facility. °Together, you can see to it that your loved one is not alone during this last and important phase of life. °You, your family, and your caregivers help you decide when hospice services should begin. If your condition improves or the disease goes into remission, you can be discharged from the hospice program. You can return to hospice care at a later time if needed. °The hospice philosophy recognizes death as the final stage of life. It helps patients continue an alert, pain-free life, and manage symptoms while surrounded by their loved ones. Hospice affirms life without hurrying death. Hospice care treats the person rather than the disease. It emphasizes quality of life with family-centered care. Hospice care involves the patient and family and helps them make decisions.  °The care is designed to: °· Relieve or decrease pain. °· Control other problems. °· Provide as much quality time as possible. °· Allow people to die with dignity. °Unlike other medical care, the focus is no longer on curing disease. The goal of hospice care is to offer as high a quality of life as possible during the end of life. In this way, the last days of life may be spent with dignity.  °With hospice care, instead of spending the last weeks or months in a hospital, a person is with loved ones in the home  or a homelike setting. About 90 percent of hospice care is provided at home. But hospice is available wherever a person lives, including a nursing home or assisted-living residence. Some residential hospices designed specifically for hospice care also exist. Hospice care is available for many types of terminal illnesses. °Hospice services are meant to serve both the patient and family members. °· Comfort. In most cases, the individual stays in his or her home or in homelike surroundings instead of in a hospital. The core of hospice is a cooperative effort by family, friends and a team of professional and volunteer caregivers working together to meet your loved one's needs. This team supplies all necessary medicines and equipment. It works with both the person involved and family members to relieve pain and symptoms. °· Support. Individuals enjoy the support of loved ones by receiving much of the basic care from family and friends. A nurse may lead the team and coordinates the day-to-day care. A doctor is also part of the team. Chaplains and social workers are available to counsel the family and their loved one. They make sure emotional, spiritual, and social needs are being met. Trained volunteers perform a wide variety of tasks as needed, such as: °· Providing companionship. °· Doing light housekeeping. °· Preparing meals. °· Running errands. °· Providing respite for the family. °· Improving quality of life. Caring for someone who is dying is emotionally and physically demanding. This can be particularly true for family members   who are primary caregivers. But you can take comfort in knowing that hospice is an act of love that can improve the quality of life for all involved. Professionals are often available to tend to the needs of grieving family members as well.  Spiritual Care. Hospice care emphasizes the spiritual needs of you and your family. People differ in their spiritual needs and religious beliefs so  spiritual care is individualized to meet the persons' and family's needs. It may include helping you to look at what death means to you, to say good-bye, or to perform a specific religious ceremony or ritual. HOW TO SELECT A PROGRAM Most hospice programs are run by nonprofit, independent organizations. Some are affiliated with hospitals, nursing homes or home health care agencies. Some are for-profit organizations. You can learn about existing hospice programs in your area from your health caregivers. ASK THE FOLLOWING:  What services are available to the patient?  What services are offered to the family?  Are bereavement services available?  How involved are the family members?  How involved is the doctor?  Who makes up the hospice care team? How are they trained or screened?  How will the individual's pain and symptoms be managed?  If circumstances change, can services be provided in different settings, such as the home or the hospital?  Is the program reviewed and licensed by the state or certified in some other way?  Are all costs covered by insurance? How much you pay for hospice care can vary greatly. It depends on the length and type of care necessary and your insurance coverage. Medicare and most private insurance plans, including managed care organizations, cover hospice care. Hospice is also covered by Medicaid in most states. Some hospice programs provide services on a sliding fee scale, based on your ability to pay. They may also provide some durable medical equipment for support within the home. Document Released: 03/19/2004 Document Revised: 02/23/2012 Document Reviewed: 12/01/2005 [facility] Patient Information 2014 [place], [place].  Cardiac Diet This diet can help prevent heart disease and stroke. Many factors influence your heart health, including eating and exercise habits. Coronary risk rises a lot with abnormal blood fat (lipid) levels. Cardiac meal planning includes  limiting unhealthy fats, increasing healthy fats, and making other small dietary changes. General guidelines are as follows:  Adjust calorie intake to reach and maintain desirable body weight.  Limit total fat intake to less than 30% of total calories. Saturated fat should be less than 7% of calories.  Saturated fats are found in animal products and in some vegetable products. Saturated vegetable fats are found in coconut oil, cocoa butter, palm oil, and palm kernel oil. Read labels carefully to avoid these products as much as possible. Use butter in moderation. Choose tub margarines and oils that have 2 grams of fat or less. Good cooking oils are canola and olive oils.  Practice low-fat cooking techniques. Do not fry food. Instead, broil, bake, boil, steam, grill, roast on a rack, stir-fry, or microwave it. Other fat reducing suggestions include:  Remove the skin from poultry.  Remove all visible fat from meats.  Skim the fat off stews, soups, and gravies before serving them.  Steam vegetables in water or broth instead of sauting them in fat.  Avoid foods with trans fat (or hydrogenated oils), such as commercially fried foods and commercially baked goods. Commercial shortening and deep-frying fats will contain trans fat.  Increase intake of fruits, vegetables, whole grains, and legumes to replace foods high  in fat.  Increase consumption of nuts, legumes, and seeds to at least 4 servings weekly. One serving of a legume equals  cup, and 1 serving of nuts or seeds equals  cup.  Choose whole grains more often. Have 3 servings per day (a serving is 1 ounce [oz]).  Eat 4 to 5 servings of vegetables per day. A serving of vegetables is 1 cup of raw leafy vegetables;  cup of raw or cooked cut-up vegetables;  cup of vegetable juice.  Eat 4 to 5 servings of fruit per day. A serving of fruit is 1 medium whole fruit;  cup of dried fruit;  cup of fresh, frozen, or canned fruit;  cup of 100%  fruit juice.  Increase your intake of dietary fiber to 20 to 30 grams per day. Insoluble fiber may help lower your risk of heart disease and may help curb your appetite.  Soluble fiber binds cholesterol to be removed from the blood. Foods high in soluble fiber are dried beans, citrus fruits, oats, apples, bananas, broccoli, Brussels sprouts, and eggplant.  Try to include foods fortified with plant sterols or stanols, such as yogurt, breads, juices, or margarines. Choose several fortified foods to achieve a daily intake of 2 to 3 grams of plant sterols or stanols.  Foods with omega-3 fats can help reduce your risk of heart disease. Aim to have a 3.5 oz portion of fatty fish twice per week, such as salmon, mackerel, albacore tuna, sardines, lake trout, or herring. If you wish to take a fish oil supplement, choose one that contains 1 gram of both DHA and EPA.  Limit processed meats to 2 servings (3 oz portion) weekly.  Limit the sodium in your diet to 1500 milligrams (mg) per day. If you have high blood pressure, talk to a registered dietitian about a DASH (Dietary Approaches to Stop Hypertension) eating plan.  Limit sweets and beverages with added sugar, such as soda, to no more than 5 servings per week. One serving is:   1 tablespoon sugar.  1 tablespoon jelly or jam.   cup sorbet.  1 cup lemonade.   cup regular soda. CHOOSING FOODS Starches  Allowed: Breads: All kinds (wheat, rye, raisin, white, oatmeal, [place], [place], and English muffin bread). Low-fat rolls: English muffins, frankfurter and hamburger buns, bagels, pita bread, tortillas (not fried). Pancakes, waffles, biscuits, and muffins made with recommended oil.  Avoid: Products made with saturated or trans fats, oils, or whole milk products. Butter rolls, cheese breads, croissants. Commercial doughnuts, muffins, sweet rolls, biscuits, waffles, pancakes, store-bought mixes. Crackers  Allowed: Low-fat crackers and snacks:  Animal, graham, rye, saltine (with recommended oil, no lard), oyster, and matzo crackers. Bread sticks, melba toast, rusks, flatbread, pretzels, and light popcorn.  Avoid: High-fat crackers: cheese crackers, butter crackers, and those made with coconut, palm oil, or trans fat (hydrogenated oils). Buttered popcorn. Cereals  Allowed: Hot or cold whole-grain cereals.  Avoid: Cereals containing coconut, hydrogenated vegetable fat, or animal fat. Potatoes / Pasta / Rice  Allowed: All kinds of potatoes, rice, and pasta (such as macaroni, spaghetti, and noodles).  Avoid: Pasta or rice prepared with cream sauce or high-fat cheese. Chow mein noodles, [place] fries. Vegetables  Allowed: All vegetables and vegetable juices.  Avoid: Fried vegetables. Vegetables in cream, butter, or high-fat cheese sauces. Limit coconut. Fruit in cream or custard. Protein  Allowed: Limit your intake of meat, seafood, and poultry to no more than 6 oz (cooked weight) per day. All lean, well-trimmed beef,  veal, pork, and lamb. All chicken and [place] without skin. All fish and shellfish. Wild game: wild duck, rabbit, pheasant, and venison. Egg whites or low-cholesterol egg substitutes may be used as desired. Meatless dishes: recipes with dried beans, peas, lentils, and tofu (soybean curd). Seeds and nuts: all seeds and most nuts.  Avoid: Prime grade and other heavily marbled and fatty meats, such as short ribs, spare ribs, rib eye roast or steak, frankfurters, sausage, bacon, and high-fat luncheon meats, mutton. Caviar. Commercially fried fish. Domestic duck, goose, venison sausage. Organ meats: liver, gizzard, heart, chitterlings, brains, kidney, sweetbreads. Dairy  Allowed: Low-fat cheeses: nonfat or low-fat cottage cheese (1% or 2% fat), cheeses made with part skim milk, such as mozzarella, farmers, string, or ricotta. (Cheeses should be labeled no more than 2 to 6 grams fat per oz.). Skim (or 1%) milk: liquid, powdered,  or evaporated. Buttermilk made with low-fat milk. Drinks made with skim or low-fat milk or cocoa. Chocolate milk or cocoa made with skim or low-fat (1%) milk. Nonfat or low-fat yogurt.  Avoid: Whole milk cheeses, including colby, cheddar, [place], [address], [place], [place], [place], [address], [place], and blue. Creamed cottage cheese, cream cheese. Whole milk and whole milk products, including buttermilk or yogurt made from whole milk, drinks made from whole milk. Condensed milk, evaporated whole milk, and 2% milk. Soups and Combination Foods  Allowed: Low-fat low-sodium soups: broth, dehydrated soups, homemade broth, soups with the fat removed, homemade cream soups made with skim or low-fat milk. Low-fat spaghetti, lasagna, chili, and Spanish rice if low-fat ingredients and low-fat cooking techniques are used.  Avoid: Cream soups made with whole milk, cream, or high-fat cheese. All other soups. Desserts and Sweets  Allowed: Sherbet, fruit ices, gelatins, meringues, and angel food cake. Homemade desserts with recommended fats, oils, and milk products. Jam, jelly, honey, marmalade, sugars, and syrups. Pure sugar candy, such as gum drops, hard candy, jelly beans, marshmallows, mints, and small amounts of dark chocolate.  Avoid: Commercially prepared cakes, pies, cookies, frosting, pudding, or mixes for these products. Desserts containing whole milk products, chocolate, coconut, lard, palm oil, or palm kernel oil. Ice cream or ice cream drinks. Candy that contains chocolate, coconut, butter, hydrogenated fat, or unknown ingredients. Buttered syrups. Fats and Oils  Allowed: Vegetable oils: safflower, sunflower, corn, soybean, cottonseed, sesame, canola, olive, or peanut. Non-hydrogenated margarines. Salad dressing or mayonnaise: homemade or commercial, made with a recommended oil. Low or nonfat salad dressing or mayonnaise.  Limit added fats and oils to 6 to 8 tsp per day (includes fats used in  cooking, baking, salads, and spreads on bread). Remember to count the "hidden fats" in foods.  Avoid: Solid fats and shortenings: butter, lard, salt pork, bacon drippings. Gravy containing meat fat, shortening, or suet. Cocoa butter, coconut. Coconut oil, palm oil, palm kernel oil, or hydrogenated oils: these ingredients are often used in bakery products, nondairy creamers, whipped toppings, candy, and commercially fried foods. Read labels carefully. Salad dressings made of unknown oils, sour cream, or cheese, such as blue cheese and Roquefort. Cream, all kinds: half-and-half, light, heavy, or whipping. Sour cream or cream cheese (even if "light" or low-fat). Nondairy cream substitutes: coffee creamers and sour cream substitutes made with palm, palm kernel, hydrogenated oils, or coconut oil. Beverages  Allowed: Coffee (regular or decaffeinated), tea. Diet carbonated beverages, mineral water. Alcohol: Check with your caregiver. Moderation is recommended.  Avoid: Whole milk, regular sodas, and juice drinks with added sugar. Condiments  Allowed: All seasonings and  condiments. Cocoa powder. "Cream" sauces made with recommended ingredients.  Avoid: Carob powder made with hydrogenated fats. SAMPLE MENU Breakfast   cup orange juice   cup oatmeal  1 slice toast  1 tsp margarine  1 cup skim milk Lunch  [place] sandwich with 2 oz [place], 2 slices bread  Lettuce and tomato slices  Fresh fruit  Carrot sticks  Coffee or tea Snack  Fresh fruit or low-fat crackers Dinner  3 oz lean ground beef  1 baked potato  1 tsp margarine   cup asparagus  Lettuce salad  1 tbs non-creamy dressing   cup peach slices  1 cup skim milk Document Released: 09/09/2008 Document Revised: 06/01/2012 Document Reviewed: 02/24/2012 [employer] Patient Information 2014 [place], [place].

## 2013-10-31 NOTE — Progress Notes (Signed)
CSW (Clinical Child psychotherapist) received call from [employer] and facility who confirmed pt could dc back. CSW arranged non emergent ambulance transport. Pt family was made aware earlier today. Pt nurse informed. CSW signing off.  Milus Fritze, LCSWA [phone]

## 2013-10-31 NOTE — Progress Notes (Addendum)
Patient ZO:XWRUE S Mapel      DOB: October 12, 1929      AVW:[id number]  Reviewed case with Dr. Waymon Amato, and discussed case with [facility] who are able to fully service patient on site at [facility].   Son agreeable to full comfort and return to SNF with resumption of Hospice services. Have canceled 530 pm meeting with son. Hospice to meet on return at some point to further elicit additional goals of care.  Kamica Florance L. Ladona Ridgel, MD MBA The Palliative Medicine Team at [facility] Phone: [phone] Pager: [phone]

## 2013-10-31 NOTE — Progress Notes (Signed)
[facility] PROGRESS NOTE    EMMARY CULBREATH UJW:[id number] DOB: March 19, 1929 DOA: 10/28/2013 PCP: Astrid Divine, MD  HPI/Brief narrative 79 y.o. female hospice comfort care patient with significant end stage dementia, hypothyroidism, anemia, GERD who currently resides with the [facility] residential facility was sent to ER when nursing discovered that the patient's blood pressure was low and her pulse was rapid, thready and irregular. In the ED, patient was hypotensive and was in A. fib with RVR. Hospitalist admission requested.  Assessment/Plan:  A. fib with RVR - Patient was admitted to telemetry and started on IV digoxin load as per cardiology recommendations. - Cardiology have changed digoxin to by mouth and have started Metoprolol - Given her hospice status, history of anemia, anticoagulation therapy considered unreasonable at this time. - Titration of rate control medications precluded by soft BP's. - As per Dr. Elsie Stain discussion with son: when medically ready, he would like her to return to ALF with hospice and does not wish for her to brought back to the hospital. MOST form has apparently been done in the past. As discussed with Dr. Ladona Ridgel, it may be reasonable to DC her even if she has slightly faster VR, as long as she is asymptomatic- which she currently is.    Hypotension - Continues to have intermittent soft BP's.  End-stage dementia - Return to hospice care as soon as possible - Mental status said to be at baseline. Somnolent in the mornings probably secondary to risperidone.  Chronic diastolic CHF - Seems compensated clinically.  Anemia - Stable   DVT prophylaxis:  Lovenox Code Status:  DO NOT RESUSCITATE Family Communication:  Discussed with son Mr. [name] 11/16 - would like to meet with Palliative Care team to further clarify GOC. Disposition Plan:  return to SNF when medically  stable   Consultants:  Cardiology  Procedures:   None  Antibiotics:   None   Subjective: Wake and alert- confused  Objective: Filed Vitals:   10/30/13 1529 10/30/13 2102 10/30/13 2147 10/31/13 0513  BP: 93/67 99/71  99/63  Pulse: 95 117  89  Temp: 97.5 F (36.4 C) 99.7 F (37.6 C) 99.2 F (37.3 C) 97.9 F (36.6 C)  TempSrc: Oral Axillary Oral Oral  Resp: 20 18  18   Height:      Weight:      SpO2: 96% 96%  98%   No intake or output data in the 24 hours ending 10/31/13 0813 Filed Weights   10/29/13 2027  Weight: 78.654 kg (173 lb 6.4 oz)     Exam:   General exam:  elderly female lying comfortably supine in bed. Respiratory system: Clear. No increased work of breathing. Cardiovascular system: S1 & S2 heard,  irregularly regular and mildly tachycardic. No JVD, murmurs, gallops, clicks or pedal edema. Telemetry: A. fib Gastrointestinal system: Abdomen is nondistended, soft and nontender. Normal bowel sounds heard. Central nervous system:  Alert & not oriented . No focal neurological deficits.   Data Reviewed: Basic Metabolic Panel:  Recent Labs Lab 10/28/13 1049 10/28/13 1157  NA  --  137  K  --  4.4  CL  --  106  GLUCOSE  --  102*  BUN  --  16  CREATININE  --  0.70  MG 2.2  --    Liver Function Tests: No results found for this basename: AST, ALT, ALKPHOS, BILITOT, PROT, ALBUMIN,  in the last 168 hours No results found for this basename: LIPASE, AMYLASE,  in the  last 168 hours No results found for this basename: AMMONIA,  in the last 168 hours CBC:  Recent Labs Lab 10/28/13 1049 10/28/13 1157  WBC 11.1*  --   NEUTROABS 8.0*  --   HGB 9.8* 10.5*  HCT 31.3* 31.0*  MCV 86.7  --   PLT 275  --    Cardiac Enzymes:  Recent Labs Lab 10/28/13 2140 10/29/13 0515  TROPONINI <0.30 <0.30   BNP (last 3 results)  Recent Labs  10/28/13 1049  PROBNP 3585.0*   CBG: No results found for this basename: GLUCAP,  in the last 168 hours  No  results found for this or any previous visit (from the past 240 hour(s)).    Additional labs: 1. None     Studies: No results found.      Scheduled Meds: . digoxin  0.25 mg Oral Daily  . enoxaparin (LOVENOX) injection  40 mg Subcutaneous Q24H  . fluticasone  1 puff Inhalation BID  . furosemide  20 mg Oral q morning - 10a  . gabapentin  100 mg Oral q morning - 10a  . iron polysaccharides  150 mg Oral q morning - 10a  . levothyroxine  50 mcg Oral Q breakfast  . metoprolol tartrate  25 mg Oral BID  . pantoprazole  40 mg Oral Daily  . polyethylene glycol  17 g Oral q morning - 10a  . risperiDONE  0.25 mg Oral BID  . sodium chloride  3 mL Intravenous Q12H  . vitamin B-12  1,000 mcg Oral q morning - 10a   Continuous Infusions:   Active Problems:   Hypotension, unspecified   Anemia   Hypothyroidism   Dementia   Atrial fibrillation   Acute diastolic CHF (congestive heart failure)    Time spent: 25 mins    HONGALGI,ANAND, MD, FACP, FHM. [facility] Pager [phone]  If 7PM-7AM, please contact night-coverage [email or web address] Password [email or web address] 10/31/2013, 8:13 AM    LOS: 3 days

## 2013-10-31 NOTE — Progress Notes (Signed)
CSW (Clinical Child psychotherapist) has faxed requested information to facility and hospice company. CSW has called facility confirm receipt and asked for return call when it is okay to set up transport for pt.  Jaimy Kliethermes, LCSWA [phone]

## 2013-10-31 NOTE — Progress Notes (Signed)
    Subjective:  Pleasant talkative, denies CP, SOB, dizziness. Hospice worker in room.   Objective:  Vital Signs in the last 24 hours: Temp:  [97.5 F (36.4 C)-99.7 F (37.6 C)] 97.9 F (36.6 C) (11/17 0513) Pulse Rate:  [89-117] 89 (11/17 0513) Resp:  [18-20] 18 (11/17 0513) BP: (93-99)/(63-74) 99/63 mmHg (11/17 0513) SpO2:  [96 %-98 %] 98 % (11/17 0513)  Intake/Output from previous day:     Physical Exam: General: Well developed, well nourished, in no acute distress. Head:  Normocephalic and atraumatic. Lungs: Clear to auscultation and percussion. Heart: Irreg irreg mildly tachycardic  No murmur, rubs or gallops.  Abdomen: soft, non-tender, positive bowel sounds. Extremities: No clubbing or cyanosis. No edema. Neurologic: Alert, talkative, not oriented to place.  pleasant, no distress.    Lab Results:  Recent Labs  10/28/13 1049 10/28/13 1157  WBC 11.1*  --   HGB 9.8* 10.5*  PLT 275  --     Recent Labs  10/28/13 1157  NA 137  K 4.4  CL 106  GLUCOSE 102*  BUN 16  CREATININE 0.70    Recent Labs  10/28/13 2140 10/29/13 0515  TROPONINI <0.30 <0.30   Telemetry: 100-120's AFIB Personally viewed.   Assessment/Plan:  Active Problems:   Hypotension, unspecified   Anemia   Hypothyroidism   Dementia   Atrial fibrillation   Acute diastolic CHF (congestive heart failure)  1) AFIB with RVR  - improved with addition of digoxin and metoprolol.   - I will change digoxin to QD.   - Continue with metoprolol 25 mg BID  - Rate control improved (mostly below 110)  - Given her hospice situation, current lack of clinical symptoms, I am comfortable with her going back to her home facility.  Palliative care following.   2) Diastolic HF  - appears compensated  - lasix, continue current dose  - improved rate control of AFIB  - Hard to uptitrate medications with relative hypotension.   3) Hypotension  - as above  4) Dementia  - chronic.   Will  sign off. Please call if questions. I have discussed with Dr. Waymon Amato.    Lakendrick Paradis 10/31/2013, 10:04 AM

## 2013-10-31 NOTE — Progress Notes (Signed)
Clinical Social Work Department BRIEF PSYCHOSOCIAL ASSESSMENT 10/31/2013  Patient:  Sharon Fletcher, Sharon Fletcher     Account Number:  [id number]     Admit date:  10/28/2013  Clinical Social Worker:  Harless Nakayama  Date/Time:  10/31/2013 10:20 AM  Referred by:  Physician  Date Referred:  10/31/2013 Referred for  ALF Placement   Other Referral:   Interview type:  Family Other interview type:   Spoke with pt son over the phone    PSYCHOSOCIAL DATA Living Status:  FACILITY Admitted from facility:  [facility] Level of care:  Assisted Living Primary support name:  [name] [phone] Primary support relationship to patient:  CHILD, ADULT Degree of support available:   Pt has supportive family    CURRENT CONCERNS Current Concerns  Post-Acute Placement   Other Concerns:    SOCIAL WORK ASSESSMENT / PLAN CSW informed pt was admitted from ALF and is under hospice care. CSW spoke with pt son and confirmed plan is to return to [facility] with [facility] CSW has spoken with representative form hospice agency and was asked to fax DC summary, FL2, and med list to [phone]. CSW has called facility and spoke with Crystal who will require completed FL2 with meds faxed to [phone]. CSW inquired if there would be any issues with pt discharging back to facility today, and Crystal informed CSW that as long as final meds with FL2 were sent over in a timely manner it should be fine. CSW to send as soon as dc summay and AVS are completed.   Assessment/plan status:  Psychosocial Support/Ongoing Assessment of Needs Other assessment/ plan:   Information/referral to community resources:   None needed    PATIENT'S/FAMILY'S RESPONSE TO PLAN OF CARE: Pt family is agreeable with plan to return to ALF with hospice care       Rigley Niess, LCSWA [phone]

## 2013-10-31 NOTE — Discharge Summary (Addendum)
Physician Discharge Summary  Sharon Fletcher BJY:[id number] DOB: Nov 11, 1929 DOA: 10/28/2013  PCP: Astrid Divine, MD  Admit date: 10/28/2013 Discharge date: 10/31/2013  Time spent: Less than 30 minutes  Recommendations for Outpatient Follow-up:  1. Dr. Maurice Small, PCP. 2. Resume [facility] services that she had PTA.  Discharge Diagnoses:  Active Problems:   Hypotension, unspecified   Anemia   Hypothyroidism   Dementia   Atrial fibrillation   Acute diastolic CHF (congestive heart failure)   Discharge Condition: Improved & Stable  Diet recommendation: Heart healthy diet  Filed Weights   10/29/13 2027  Weight: 78.654 kg (173 lb 6.4 oz)    History of present illness & hospital course:  79 y.o. female hospice comfort care patient with significant end stage dementia, hypothyroidism, anemia, GERD who currently resides with the [facility] residential facility was sent to ER when nursing discovered that the patient's blood pressure was low and her pulse was rapid, thready and irregular. In the ED, patient was hypotensive and was in A. fib with RVR. Hospitalist admission requested. Patient was admitted to telemetry for monitoring of her A. fib with RVR. Cardiology was consulted and patient was IV loaded with digoxin. Other rate control agent such as calcium channel blockers and beta blockers could not be started initially secondary to hypotension. Hypotension improved after IV fluids. She was then switched to oral digoxin but continued to have fast ventricular rate. By this time her blood pressures had improved and patient was started on metoprolol. Patient all along has been asymptomatic of any chest pain, palpitations or dyspnea. However heart rate is mostly below 110's. She is pleasantly confused and mental status is said to be at baseline. After extensive discussion with the health care power of attorney Mr. [name], his wishes for patient are comfort  care with no return to hospital.  Given her hospice situation, current lack of clinical symptoms, she will be released back under her hospice care. Mr. [name] is agreeable to current plan of care. She is to resume community hospice at the nursing facility. MOST form is already in place PTA. Dr. Ladona Ridgel from palliative care team also discussed with son of above plans.  Consultations:  Cardiology  Palliative care team  Procedures:  None    Discharge Exam:  Complaints:  None. Patient confused but not agitated.  Filed Vitals:   10/31/13 0513 10/31/13 1020 10/31/13 1046 10/31/13 1207  BP: 99/63  103/65   Pulse: 89  114 143  Temp: 97.9 F (36.6 C)     TempSrc: Oral     Resp: 18     Height:      Weight:      SpO2: 98% 98%      General exam: elderly female lying comfortably supine in bed.  Respiratory system: Clear. No increased work of breathing.  Cardiovascular system: S1 & S2 heard, irregularly regular and mildly tachycardic. No JVD, murmurs, gallops, clicks or pedal edema. Telemetry: A. fib with ventricular rate will mostly in the 110s but at times goes up to 130s. Gastrointestinal system: Abdomen is nondistended, soft and nontender. Normal bowel sounds heard.  Central nervous system: Alert but not oriented and does not follow instructions. No focal neurological deficits.   Discharge Instructions      Discharge Orders   Future Orders Complete By Expires   Call MD for:  difficulty breathing, headache or visual disturbances  As directed    Call MD for:  severe uncontrolled pain  As directed  Diet - low sodium heart healthy  As directed    Increase activity slowly  As directed        Medication List    STOP taking these medications       PRESCRIPTION MEDICATION      TAKE these medications       acetaminophen 500 MG tablet  Commonly known as:  TYLENOL  Take 1,000 mg by mouth every 8 (eight) hours. Scheduled 0800, 1400, 2000.     beclomethasone 40 MCG/ACT  inhaler  Commonly known as:  QVAR  Inhale 2 puffs into the lungs 2 (two) times daily.     carbamide peroxide 6.5 % otic solution  Commonly known as:  DEBROX  Place 3 drops into both ears every 30 (thirty) days. On the first day of each month.     clotrimazole-betamethasone cream  Commonly known as:  LOTRISONE  Apply 1 application topically 2 (two) times daily.     digoxin 0.125 MG tablet  Commonly known as:  LANOXIN  Take 1 tablet (0.125 mg total) by mouth daily.     fish oil-omega-3 fatty acids 1000 MG capsule  Take 1 g by mouth 2 (two) times daily.     furosemide 20 MG tablet  Commonly known as:  LASIX  Take 20 mg by mouth every morning.     gabapentin 100 MG capsule  Commonly known as:  NEURONTIN  Take 100 mg by mouth every morning.     guaifenesin 100 MG/5ML syrup  Commonly known as:  ROBITUSSIN  Take 100 mg by mouth every 4 (four) hours as needed for cough.     iron polysaccharides 150 MG capsule  Commonly known as:  NIFEREX  Take 150 mg by mouth every morning.     levothyroxine 50 MCG tablet  Commonly known as:  SYNTHROID, LEVOTHROID  Take 50 mcg by mouth every morning.     loperamide 2 MG capsule  Commonly known as:  IMODIUM  Take 2 mg by mouth 4 (four) times daily as needed for diarrhea or loose stools.     LORazepam 1 MG tablet  Commonly known as:  ATIVAN  Place 1 tablet (1 mg total) under the tongue every 8 (eight) hours as needed (agitation.).     metoprolol tartrate 25 MG tablet  Commonly known as:  LOPRESSOR  Take 1 tablet (25 mg total) by mouth 2 (two) times daily.     nystatin 100000 UNIT/GM Powd  Apply 0.5 g topically 2 (two) times daily. To affected areas under breasts     omeprazole 20 MG capsule  Commonly known as:  PRILOSEC  Take 20 mg by mouth every morning.     polyethylene glycol packet  Commonly known as:  MIRALAX / GLYCOLAX  Take 17 g by mouth every morning.     PREVIDENT 5000 DRY MOUTH 1.1 % Pste  Generic drug:  Sodium Fluoride   Place 1 application onto teeth 2 (two) times daily.     risperiDONE 0.25 MG tablet  Commonly known as:  RISPERDAL  Take 0.25 mg by mouth 2 (two) times daily.     vitamin B-12 1000 MCG tablet  Commonly known as:  CYANOCOBALAMIN  Take 1,000 mcg by mouth every morning.       Follow-up Information   Schedule an appointment as soon as possible for a visit with Astrid Divine, MD.   Specialty:  Family Medicine   Contact information:   [address] [place]  [address] [phone]        The results of significant diagnostics from this hospitalization (including imaging, microbiology, ancillary and laboratory) are listed below for reference.    Significant Diagnostic Studies: Dg Chest Port 1 View  10/28/2013   CLINICAL DATA:  Afib.  EXAM: PORTABLE CHEST - 1 VIEW  COMPARISON:  05/31/2013.  FINDINGS: Mediastinum and hilar structures normal. Lungs are clear. No pleural effusion. No pneumothorax. Cardiomegaly with normal pulmonary vascularity. No CHF. Prominent degenerative changes noted both shoulders with deformity of the right humeral head. Calcific densities projected over the left shoulder may represent loose bodies.  IMPRESSION: 1. Stable cardiomegaly, no CHF.  No acute cardiopulmonary disease. 2. Severe degenerative changes both shoulders.   Electronically Signed   By: Maisie Fus  Register   On: 10/28/2013 11:24    Microbiology: No results found for this or any previous visit (from the past 240 hour(s)).   Labs: Basic Metabolic Panel:  Recent Labs Lab 10/28/13 1049 10/28/13 1157  NA  --  137  K  --  4.4  CL  --  106  GLUCOSE  --  102*  BUN  --  16  CREATININE  --  0.70  MG 2.2  --    Liver Function Tests: No results found for this basename: AST, ALT, ALKPHOS, BILITOT, PROT, ALBUMIN,  in the last 168 hours No results found for this basename: LIPASE, AMYLASE,  in the last 168 hours No results found for this basename: AMMONIA,  in the last 168  hours CBC:  Recent Labs Lab 10/28/13 1049 10/28/13 1157  WBC 11.1*  --   NEUTROABS 8.0*  --   HGB 9.8* 10.5*  HCT 31.3* 31.0*  MCV 86.7  --   PLT 275  --    Cardiac Enzymes:  Recent Labs Lab 10/28/13 2140 10/29/13 0515  TROPONINI <0.30 <0.30   BNP: BNP (last 3 results)  Recent Labs  10/28/13 1049  PROBNP 3585.0*   CBG: No results found for this basename: GLUCAP,  in the last 168 hours    Signed:  Marcellus Scott, MD, FACP, FHM. [facility] Pager [phone]  If 7PM-7AM, please contact night-coverage [email or web address] Password [facility] 10/31/2013, 2:38 PM

## 2013-12-15 DEATH — deceased

## 2013-12-16 ENCOUNTER — Telehealth: Payer: Self-pay

## 2013-12-16 NOTE — Telephone Encounter (Signed)
Patient past away @ [facility] per Obituary in [place] [employer] °

## 2015-02-20 IMAGING — CR DG SHOULDER 2+V*R*
4 series · 4 of 4 positions shown · non-contrast
Comparison: 05/05/2012

CLINICAL DATA: Fall.  Confusion.  Dementia.

RIGHT SHOULDER - 2+ VIEW

[x shoulder ap right (1 of 2)]
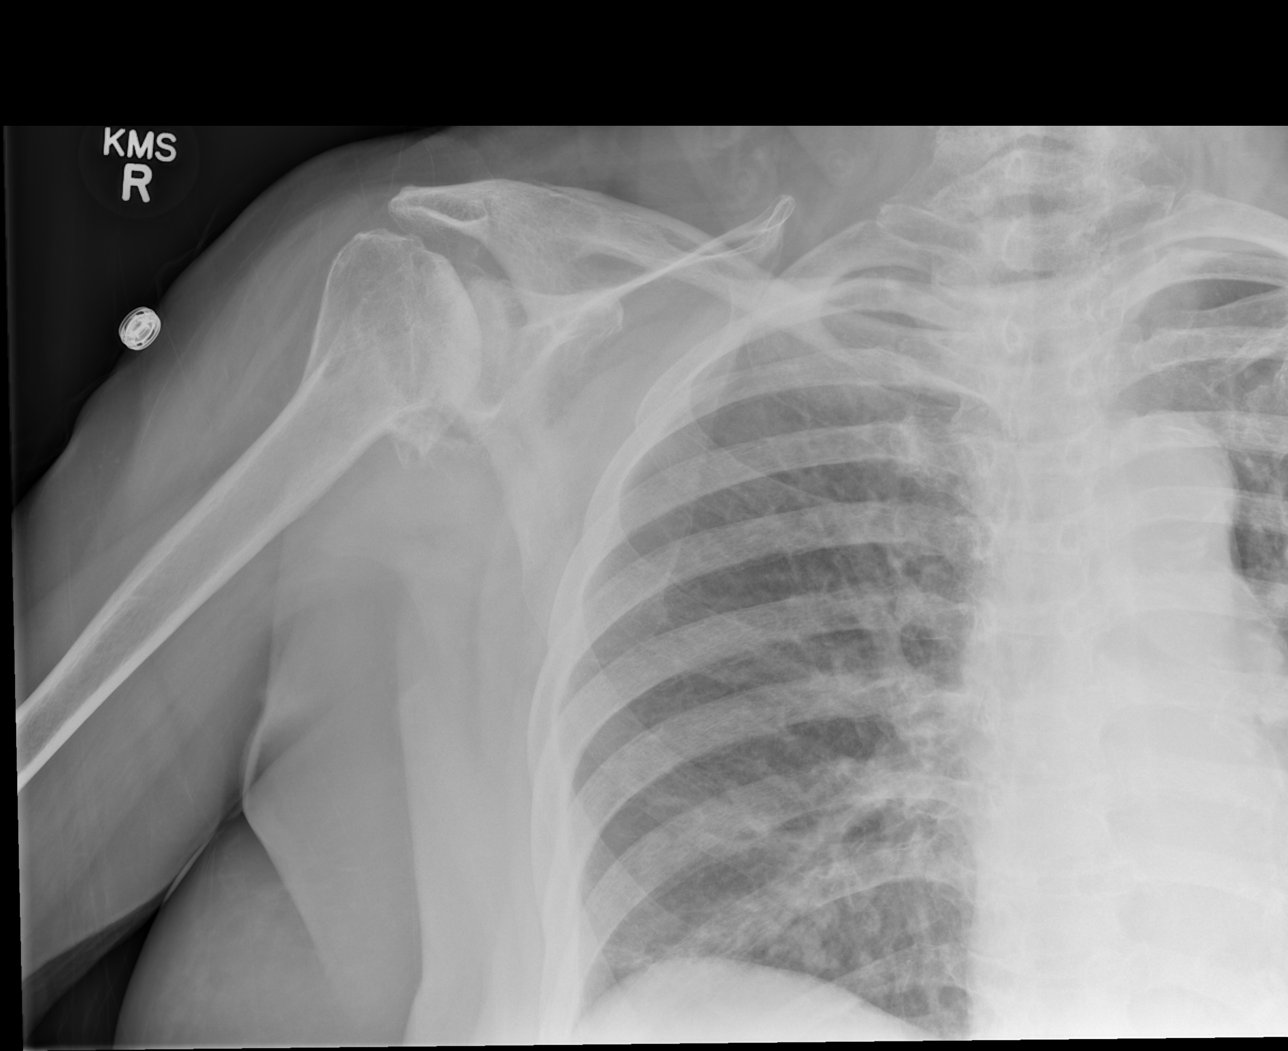

[x shoulder ap right (2 of 2)]
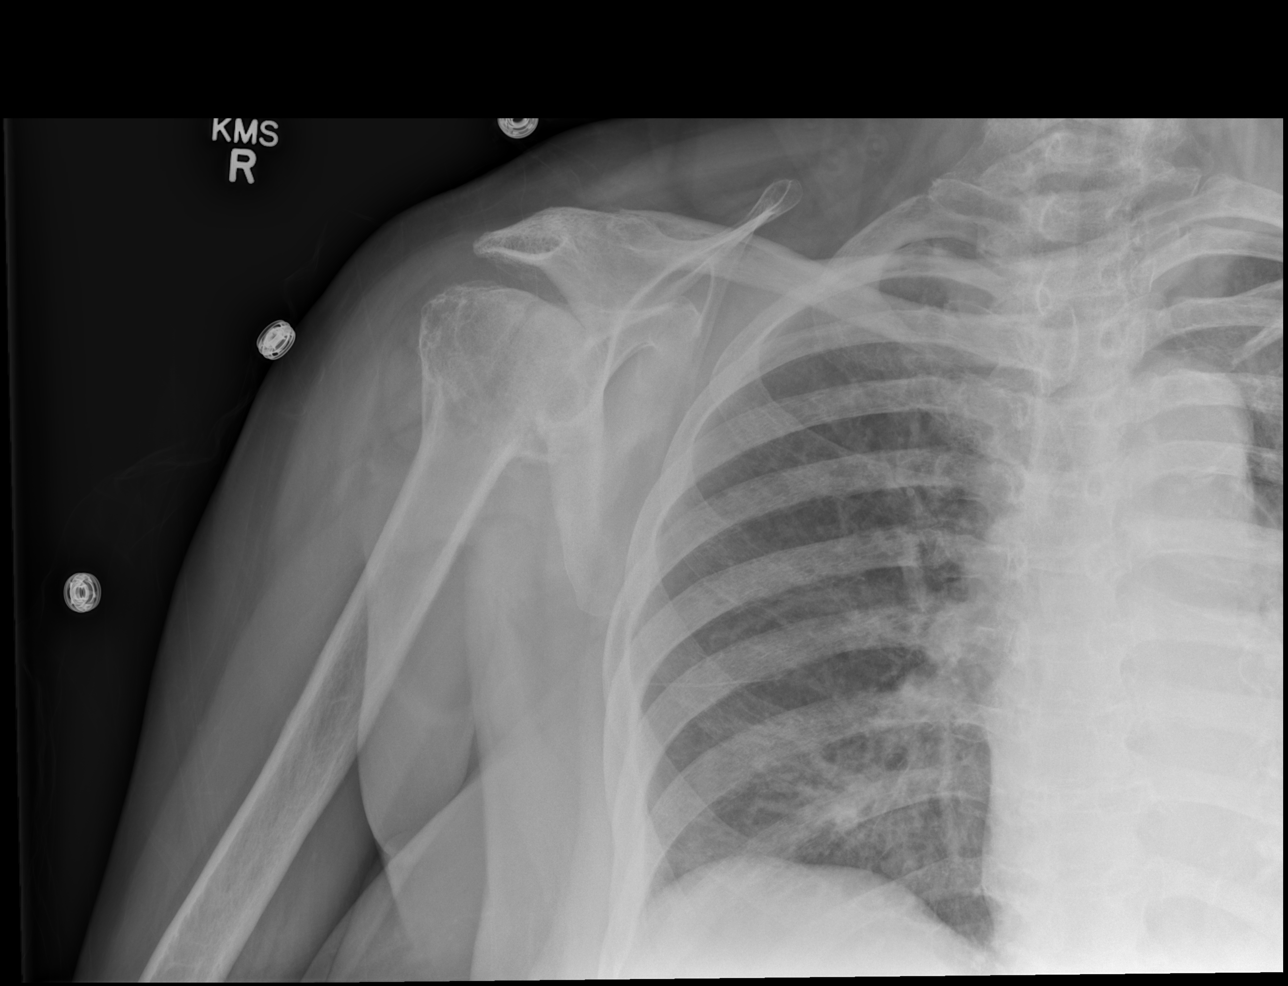

[x shoulder axillary right (1 of 2)]
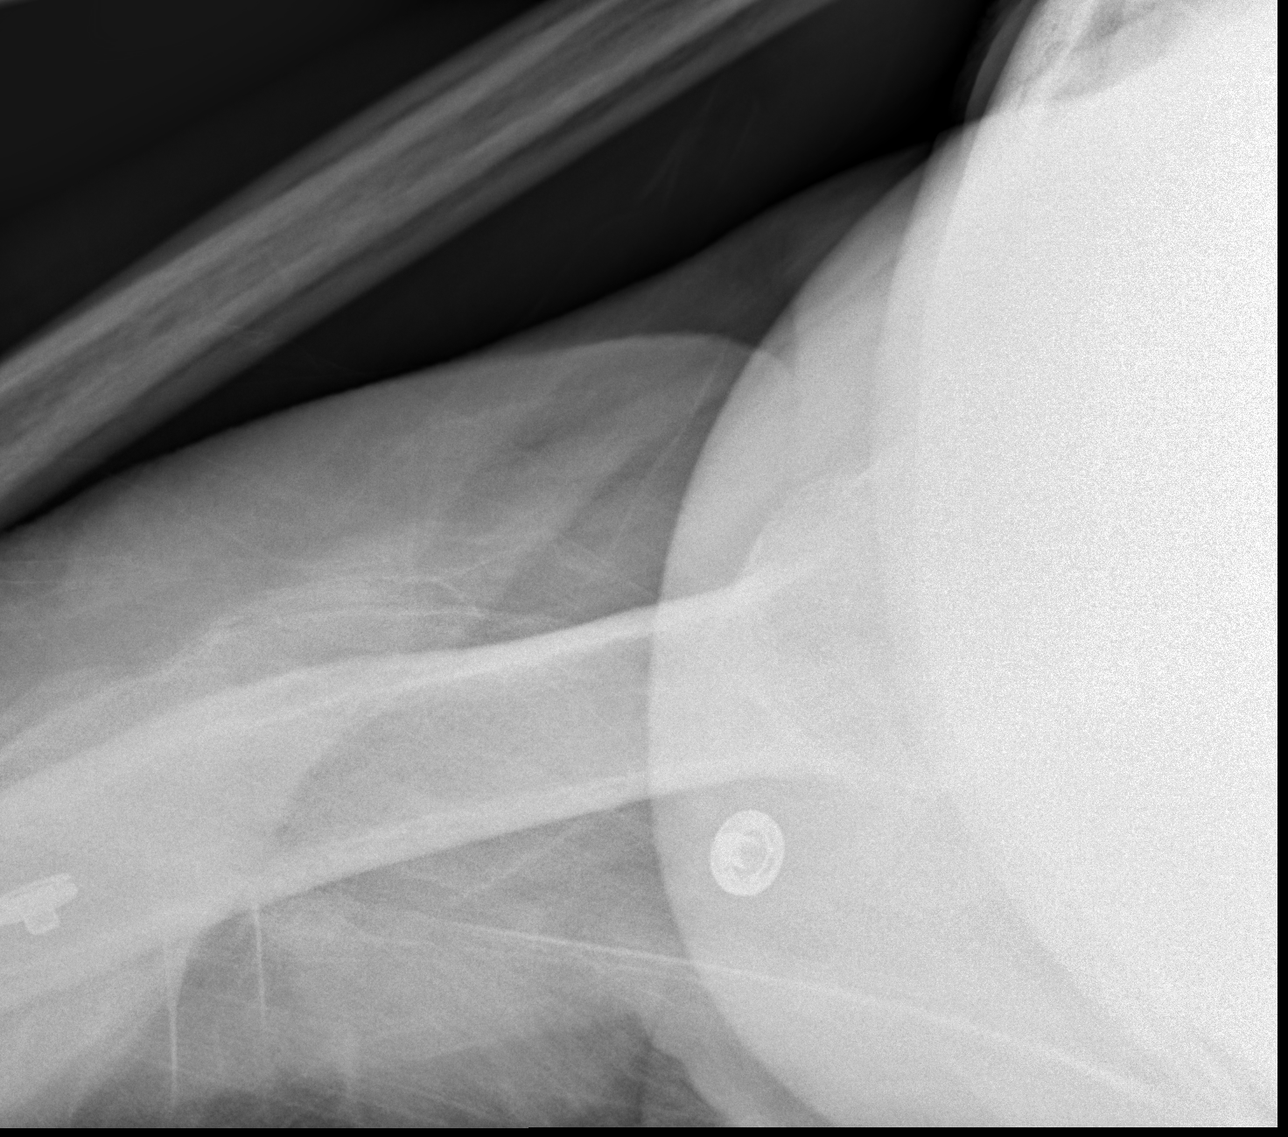

[x shoulder axillary right (2 of 2)]
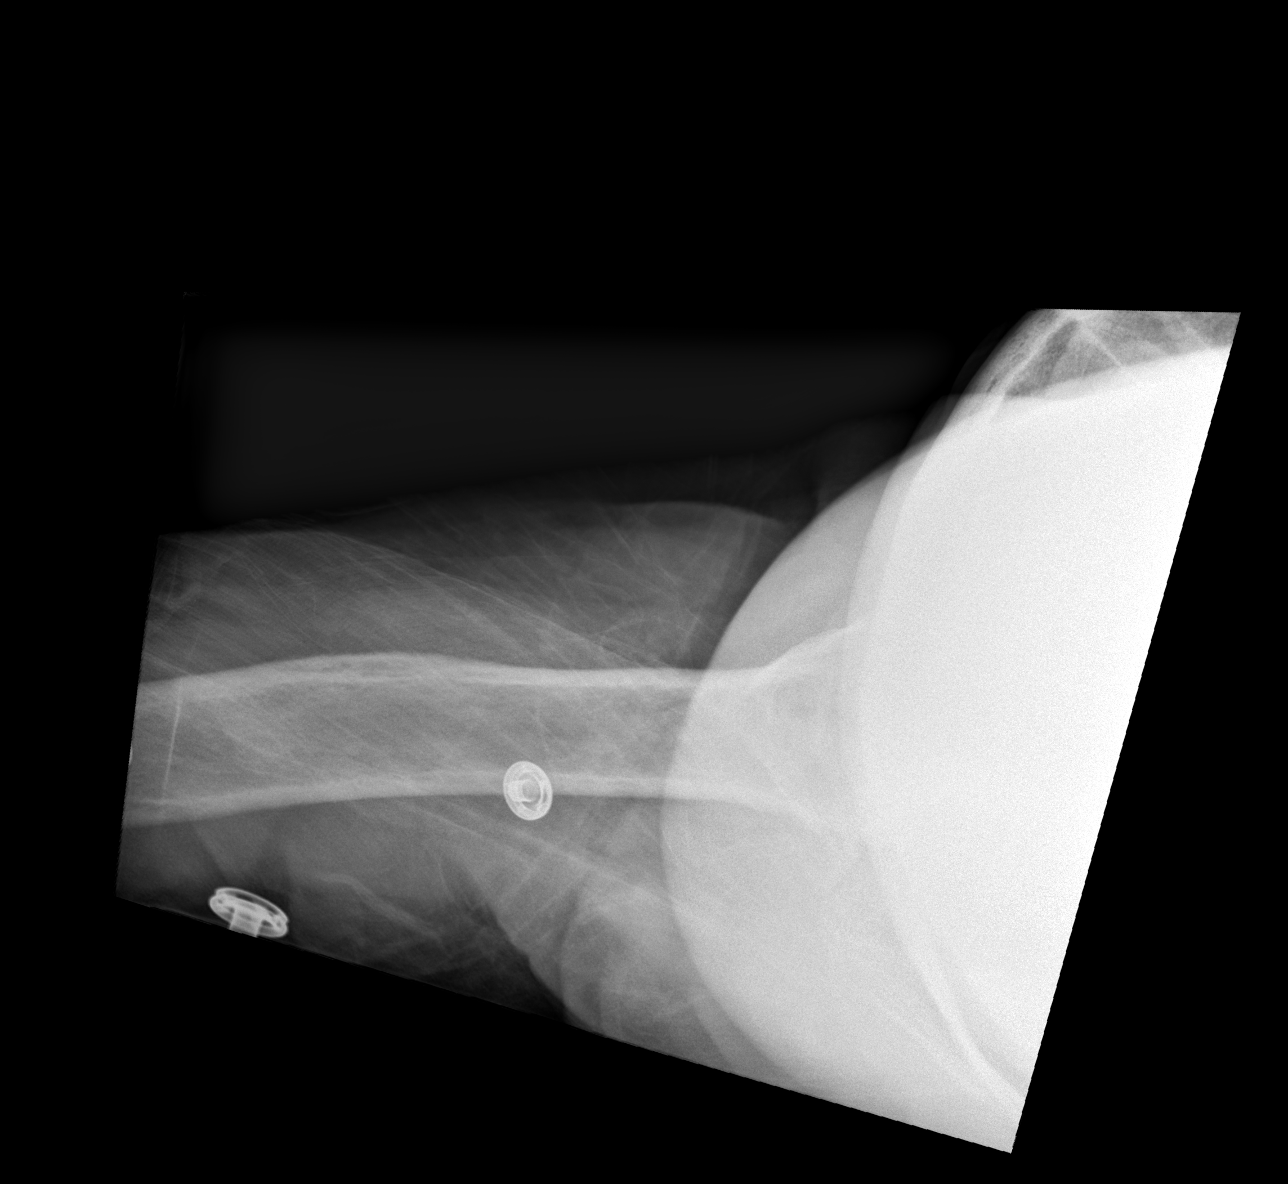

[4 of 4 positions shown; findings below may reference images not displayed]

FINDINGS: Right proximal humeral deformity with evidence old
fracture and remote flattening of the humeral head.  Subcortical
glenohumeral sclerosis.  Overall configuration appears similar to
prior chest radiograph from last year. Suspected loose fragments in
the glenohumeral joint.  Glenohumeral joint not seen on the
axillary view.
IMPRESSION: 1.  Chronic deformity, right humeral head and glenoid, with
prominent spurring, flattening of the humeral head, and subcortical
sclerosis.  This seems to have been present on prior radiographs
from last year.  Probable fragments in the glenohumeral joint.

## 2015-02-20 IMAGING — CR DG PELVIS 1-2V
2 series · 2 of 2 positions shown · non-contrast
Comparison: None.

CLINICAL DATA: Fall.  Confusion.

PELVIS - 1-2 VIEW

[x pelvis (1 of 2)]
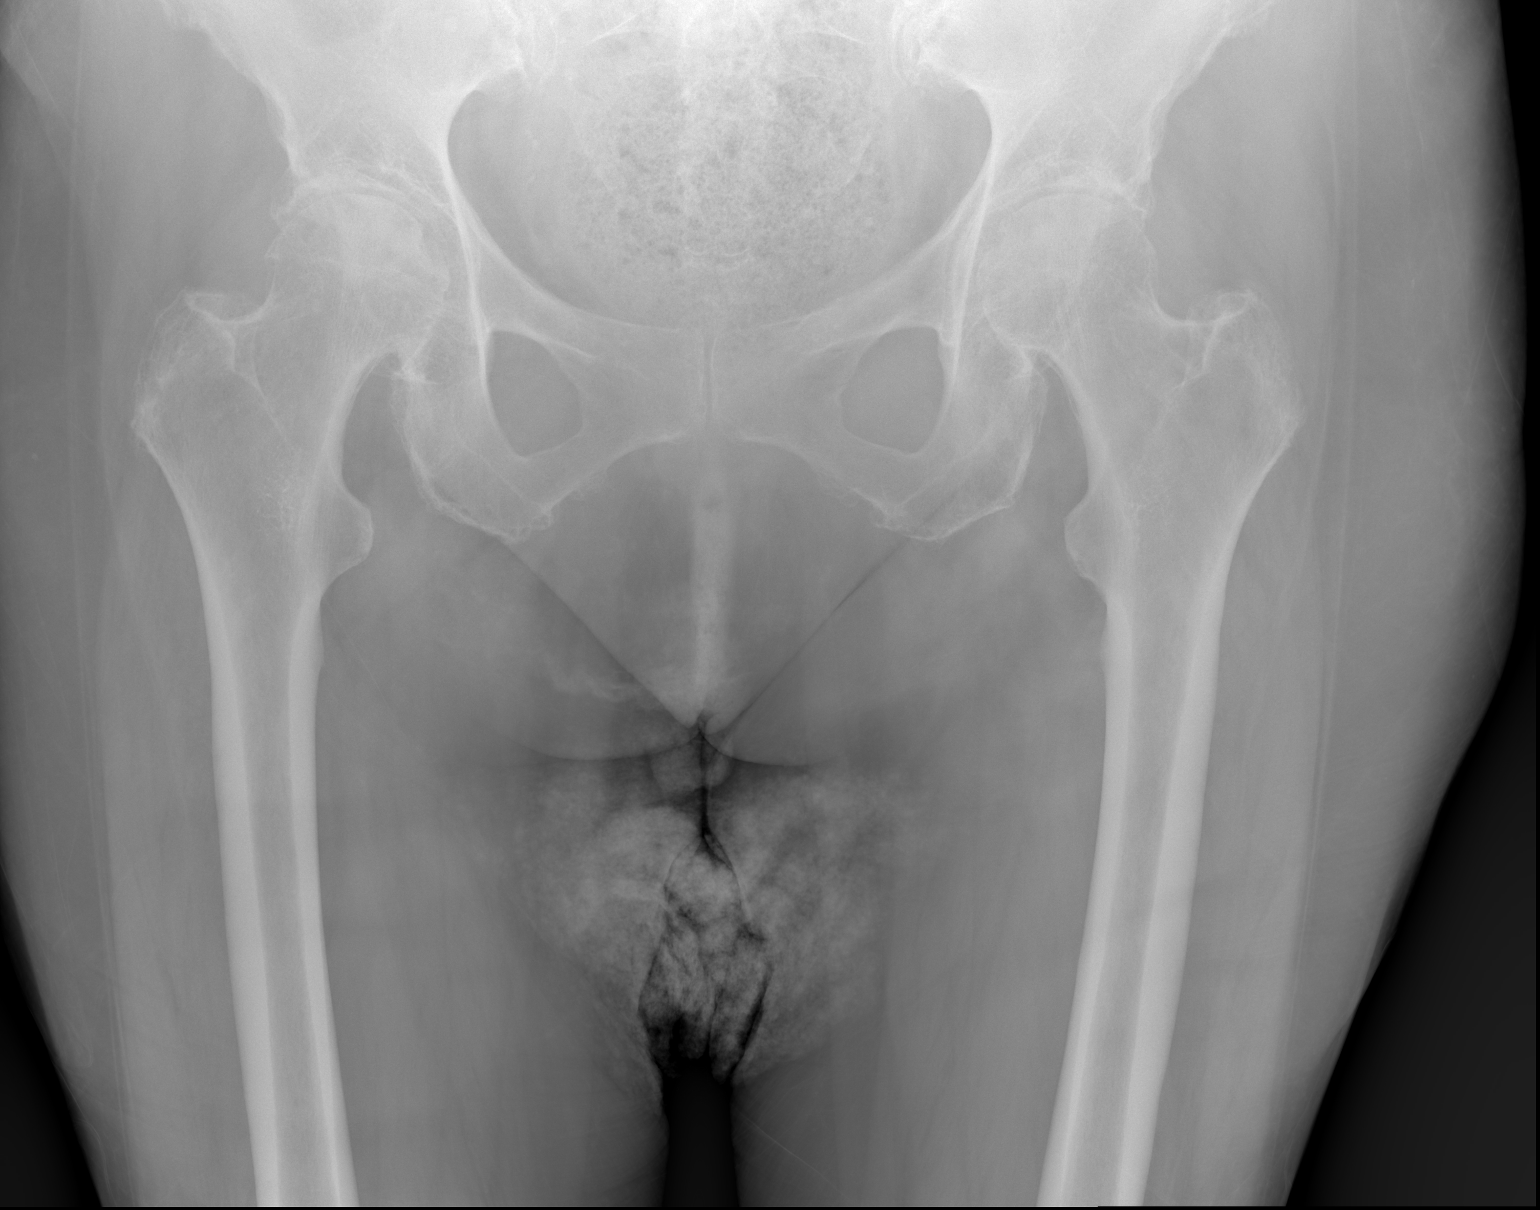

[x pelvis (2 of 2)]
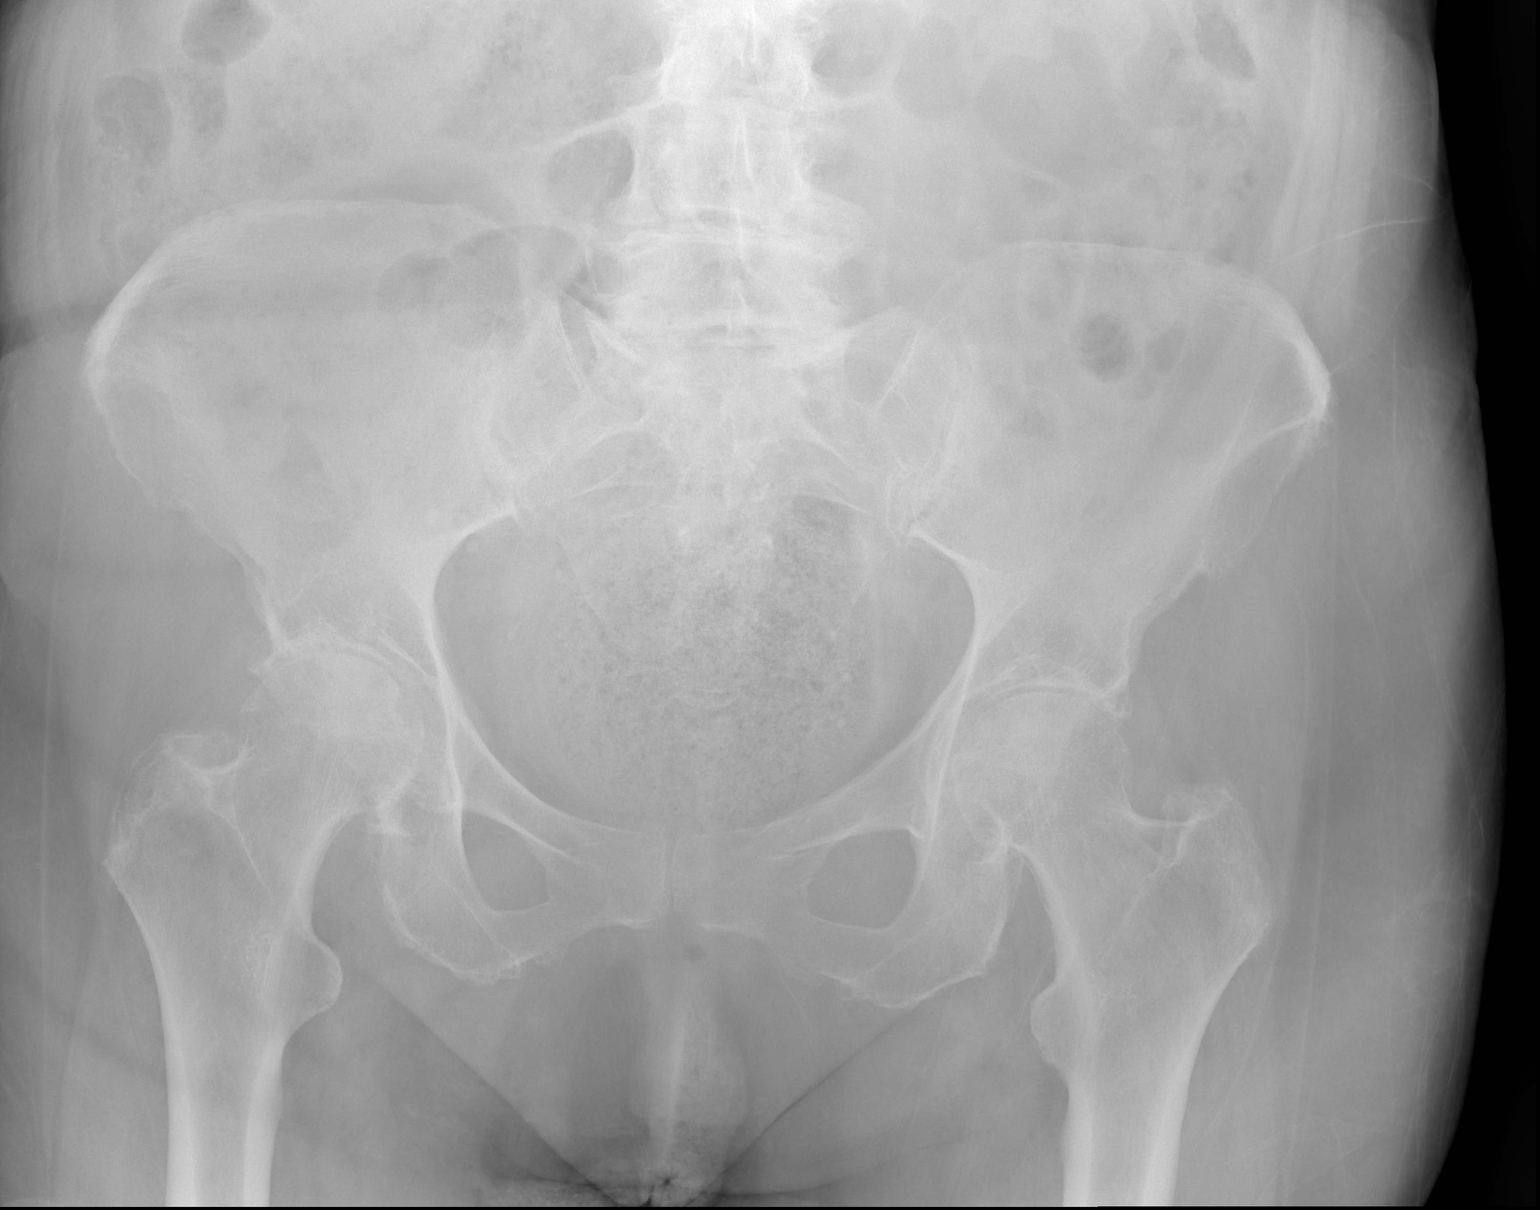

[2 of 2 positions shown; findings below may reference images not displayed]

FINDINGS: Prominent osteoarthritis of the hips noted with loss of
articular space and associated spurring.  Mild subcortical
sclerosis noted.

Lower lumbar spondylosis noted.  There is slight irregularity of
the left superior pubic ramus which could conceivably represent a
nondisplaced fracture, but no definite inferior ramus fractures
noted.
IMPRESSION: 1.  Mild irregularity, left superior pubic ramus, could possibly
represent a fracture but may reflect artifact due to rotation.
This could be further assessed with CT scan of the pelvis.
2.  Prominent osteoarthritis of the hips.
